# Patient Record
Sex: Female | Born: 1965 | Race: White | Hispanic: No | Smoking: Never smoker
Health system: Southern US, Community
[De-identification: ages and names within clinical notes are randomized; demographics above are authoritative.]

## PROBLEM LIST (undated history)

## (undated) DIAGNOSIS — K219 Gastro-esophageal reflux disease without esophagitis: Secondary | ICD-10-CM

## (undated) HISTORY — PX: OVARIAN CYST SURGERY: SHX726

## (undated) HISTORY — PX: TUBAL LIGATION: SHX77

---

## 2005-03-06 ENCOUNTER — Inpatient Hospital Stay (HOSPITAL_COMMUNITY): Admission: AD | Admit: 2005-03-06 | Discharge: 2005-03-06 | Payer: Self-pay | Admitting: *Deleted

## 2005-04-23 ENCOUNTER — Ambulatory Visit: Payer: Self-pay | Admitting: Sports Medicine

## 2005-08-16 DIAGNOSIS — S63509A Unspecified sprain of unspecified wrist, initial encounter: Secondary | ICD-10-CM | POA: Insufficient documentation

## 2005-10-11 ENCOUNTER — Ambulatory Visit: Payer: Self-pay | Admitting: Sports Medicine

## 2005-11-05 ENCOUNTER — Ambulatory Visit: Payer: Self-pay | Admitting: Sports Medicine

## 2005-11-16 ENCOUNTER — Emergency Department (HOSPITAL_COMMUNITY): Admission: EM | Admit: 2005-11-16 | Discharge: 2005-11-16 | Payer: Self-pay | Admitting: Emergency Medicine

## 2005-12-03 ENCOUNTER — Encounter: Admission: RE | Admit: 2005-12-03 | Discharge: 2005-12-03 | Payer: Self-pay | Admitting: Gastroenterology

## 2005-12-03 ENCOUNTER — Ambulatory Visit: Payer: Self-pay | Admitting: Sports Medicine

## 2006-01-02 ENCOUNTER — Ambulatory Visit (HOSPITAL_COMMUNITY): Admission: RE | Admit: 2006-01-02 | Discharge: 2006-01-02 | Payer: Self-pay | Admitting: Obstetrics and Gynecology

## 2006-01-02 ENCOUNTER — Encounter (INDEPENDENT_AMBULATORY_CARE_PROVIDER_SITE_OTHER): Payer: Self-pay | Admitting: *Deleted

## 2006-01-07 ENCOUNTER — Ambulatory Visit: Payer: Self-pay | Admitting: Sports Medicine

## 2006-04-29 ENCOUNTER — Ambulatory Visit: Payer: Self-pay | Admitting: Sports Medicine

## 2006-05-27 ENCOUNTER — Encounter (INDEPENDENT_AMBULATORY_CARE_PROVIDER_SITE_OTHER): Payer: Self-pay | Admitting: Family Medicine

## 2006-05-27 ENCOUNTER — Ambulatory Visit: Payer: Self-pay | Admitting: Sports Medicine

## 2006-10-31 ENCOUNTER — Ambulatory Visit: Payer: Self-pay | Admitting: Sports Medicine

## 2006-10-31 DIAGNOSIS — L259 Unspecified contact dermatitis, unspecified cause: Secondary | ICD-10-CM | POA: Insufficient documentation

## 2006-10-31 DIAGNOSIS — M25569 Pain in unspecified knee: Secondary | ICD-10-CM | POA: Insufficient documentation

## 2006-11-27 ENCOUNTER — Encounter: Admission: RE | Admit: 2006-11-27 | Discharge: 2006-11-27 | Payer: Self-pay | Admitting: Obstetrics and Gynecology

## 2006-12-12 ENCOUNTER — Ambulatory Visit: Payer: Self-pay | Admitting: Sports Medicine

## 2008-07-06 ENCOUNTER — Inpatient Hospital Stay (HOSPITAL_COMMUNITY): Admission: RE | Admit: 2008-07-06 | Discharge: 2008-07-08 | Payer: Self-pay | Admitting: Obstetrics and Gynecology

## 2008-07-06 ENCOUNTER — Encounter (HOSPITAL_COMMUNITY): Payer: Self-pay | Admitting: Obstetrics and Gynecology

## 2009-02-21 ENCOUNTER — Ambulatory Visit: Payer: Self-pay | Admitting: Family Medicine

## 2009-02-21 DIAGNOSIS — M6740 Ganglion, unspecified site: Secondary | ICD-10-CM | POA: Insufficient documentation

## 2009-02-21 DIAGNOSIS — M25539 Pain in unspecified wrist: Secondary | ICD-10-CM

## 2009-07-25 ENCOUNTER — Ambulatory Visit: Payer: Self-pay | Admitting: Sports Medicine

## 2009-07-27 ENCOUNTER — Ambulatory Visit: Payer: Self-pay | Admitting: Sports Medicine

## 2009-07-31 ENCOUNTER — Ambulatory Visit: Payer: Self-pay | Admitting: Sports Medicine

## 2009-09-06 ENCOUNTER — Encounter: Admission: RE | Admit: 2009-09-06 | Discharge: 2009-09-06 | Payer: Self-pay | Admitting: Obstetrics and Gynecology

## 2010-04-17 NOTE — Assessment & Plan Note (Signed)
Summary: R WRIST GANGLION   Vital Signs:  Patient profile:   45 year old female BP sitting:   119 / 84  Vitals Entered By: Lillia Pauls CMA (Jul 25, 2009 10:33 AM)  History of Present Illness: RT wrist ganglion was .4 x .6 cms in dec won't go away soemtimes uncomfortable limits wrist movement wants to see if we can inject  This first was hurt 4 years ago during a pregnancy  since then has come and gone saw [place] but declined any surgery  Allergies: No Known Drug Allergies  Physical Exam  General:  Well-developed,well-nourished,in no acute distress; alert,appropriate and cooperative throughout examination Msk:  RT wrist shows a soft movable cyst over radiocarpal joint mid this is compressible not painful not red  wrist extension is slt limited other motions are good Additional Exam:  MSK [place] RT wrist shows a ganglion that is .5cm x.7cm on long scan on Trans scan this is 1.1cm wide no inc in doppler flow   Impression & Recommendations:  Problem # 1:  GANGLION CYST, WRIST, RIGHT (ICD-727.41)  With US guidance we did injection and multiple punctures of ganglion visualized on [place]:  cleanse with alcohol Topical analgesic spray : Ethyl choride and then local wheal w 1% xylocaine 1cc Joint RT radio carpla ganglion  Approached in typical fashion with: US guidance demonstrated inj of fluid into cyst then we punctured in 4 separate areas  Completed without difficulty Meds: .5 cc kenalog 10 and .5 cc lidocaine Needle:21 G Aftercare instructions and Red flags advised  note after procedure this was much smaller and good wrist motion  Orders: [place] LIMITED (16109) Trigger Point Injection (1 or 2 muscles) (60454)  Problem # 2:  WRIST PAIN, RIGHT (ICD-719.43)  somewhat more painful than last visit  would use wrist splints next 6 wks  Orders: [place] LIMITED (09811) Trigger Point Injection (1 or 2 muscles) (91478)

## 2010-04-17 NOTE — Assessment & Plan Note (Signed)
Summary: 8:15AM- F/U PER Barnard Sharps,[facility]   History of Present Illness: Tara Lopez returns today for f/u of US guided drainage of ganglion cyst at RT radiocarpal joint 48 hrs p drainage she had a lot of wrist swelling we saw her and splinted and used prophylactic keflex within 24 more hours pan and swelling decreased  now usijng wrist freely no real swelling no fever bruising is fading some not using splint  Allergies: No Known Drug Allergies  Physical Exam  General:  Well-developed,well-nourished,in no acute distress; alert,appropriate and cooperative throughout examination Msk:  RT wrist has returned to baseline motion there is still a slt lag in full extension when compared to left brusing is noted no swelling no warmth ganglion cyst is no longer palpable   Impression & Recommendations:  Problem # 1:  GANGLION CYST, WRIST, RIGHT (ICD-727.41) This appears to have resolved w drainage  we should rescan in 8 wks as this has been such a persistent problem  Problem # 2:  WRIST PAIN, RIGHT (ICD-719.43) Assessment: Improved has improved and not usning splint or meds for pain  Complete Medication List: 1)  Keflex 500 Mg Caps (Cephalexin) .Marland Kitchen.. 1 by mouth bid

## 2010-04-17 NOTE — Assessment & Plan Note (Signed)
Summary: 8:30-CYST THAT WAS DRAINED IS INFECTED   Vital Signs:  Patient profile:   45 year old female Temp:     98.6 degrees F  Vitals Entered By: Lillia Pauls CMA (Jul 27, 2009 9:10 AM)  History of Present Illness: 2 days ago I did an injection and pucture of gaglion cyst at radiocarpal joint of RT hand in this patient.  last night became very painful, more bruising and swelling not much pain in first 24 hours no fever or chills no redness using wrist splint and that helps as movement of wrist is painful  Allergies: No Known Drug Allergies  Physical Exam  General:  Well-developed,well-nourished,in no acute distress; alert,appropriate and cooperative throughout examination afebrile Msk:  dorsum of RT wrist and hand are swollen there is no visible ganglion now but a lot of bruising around the area where this was injected and punctured wrist is painful w movement and this is limited 2/2 pain no redness or excess warmth  Repeat scan shows that the ganglion is essentially gone but there is now a joint effusion over radiocarpal joint there is now also increased doppler activity   Impression & Recommendations:  Problem # 1:  WRIST PAIN, RIGHT (ICD-719.43)  suspect this is a reactive effusion as I think the procedure prob triggered bleeding that went into the joint  warned about possibility of infection and I think we need to go ahead and start Keflex 2/2 risk of joint infection use 500 two times a day p 1000 first dose reck in 4 days  wrap and splint for support  warning signs - fever, redness, feeling sick, excess swelling call me and we will reck sooner  Orders: No Charge Patient Arrived (NCPA0) (NCPA0)  Problem # 2:  GANGLION CYST, WRIST, RIGHT (ICD-727.41)  I think the puncture eliminated most of swelling in ganglion  howeve, I wonder now if this was connected to joint capsule and not tendon sheath and that may have made joint irritation more likely  will  follow closely for return  Orders: No Charge Patient Arrived (NCPA0) (NCPA0)  Complete Medication List: 1)  Keflex 500 Mg Caps (Cephalexin) .Marland Kitchen.. 1 by mouth bid Prescriptions: KEFLEX 500 MG CAPS (CEPHALEXIN) 1 by mouth bid  #20 x 0   Entered and Authorized by:   Enid Baas MD   Signed by:   Enid Baas MD on 07/27/2009   Method used:   Electronically to        [facility]  [address] [phone]* (retail)       [address]       [place], [place]  [address]       Ph: [phone] or [phone]       Fax: [phone]   RxID:   [id number]

## 2010-06-27 LAB — CBC
HCT: 36.3 % (ref 36.0–46.0)
Hemoglobin: 12.6 g/dL (ref 12.0–15.0)
MCHC: 34.7 g/dL (ref 30.0–36.0)
MCV: 92.2 fL (ref 78.0–100.0)
Platelets: 160 10*3/uL (ref 150–400)
Platelets: 194 10*3/uL (ref 150–400)
RBC: 3.6 MIL/uL — ABNORMAL LOW (ref 3.87–5.11)
RBC: 3.95 MIL/uL (ref 3.87–5.11)
WBC: 10.8 10*3/uL — ABNORMAL HIGH (ref 4.0–10.5)

## 2010-06-27 LAB — RPR: RPR Ser Ql: NONREACTIVE

## 2010-06-27 LAB — TYPE AND SCREEN

## 2010-07-31 NOTE — Discharge Summary (Signed)
NAMECAMARI, Tara Lopez                ACCOUNT NO.:  [id number]   MEDICAL RECORD NO.:  [id number]          PATIENT TYPE:  INP   LOCATION:  9121                          FACILITY:  [facility]   PHYSICIAN:  Juluis Mire, M.D.   DATE OF BIRTH:  Nov 21, 1965   DATE OF ADMISSION:  07/06/2008  DATE OF DISCHARGE:  07/08/2008                               DISCHARGE SUMMARY   ADMITTING DIAGNOSES:  1. Intrauterine pregnancy at term.  2. Previous cesarean section.  3. Multiparity, desires permanent sterilization.   DISCHARGE DIAGNOSES:  1. Status post low-transverse cesarean section.  2. A viable female infant.  3. Permanent sterilization.   PROCEDURE:  1. Repeat low-transverse cesarean section.  2. Bilateral tubal ligation.   REASON FOR ADMISSION:  Please see written H and P.   HOSPITAL COURSE:  The patient is a 45 year old gravida 2, para 1, that  was admitted to [facility] for scheduled cesarean  section.  Due to multiparity, the patient had also requested permanent  sterilization.  On the morning of admission, the patient was taken to  the operating room where spinal anesthesia was administered without  difficulty.  A low-transverse incision was made with delivery of a  viable female infant weighing 7 pounds 7 ounces with Apgars of 8 at 1  minute and 9 at 5 minutes.  Bilateral tubal ligation was performed  without difficulty.  The patient tolerated the procedure well and was  taken to the recovery room in stable condition.  On postoperative day  #1, the patient was without complaints.  Vital signs were stable.  She  was afebrile.  Abdomen soft.  Fundus firm and nontender.  Abdominal  dressing was partially removed with an incision that was clean, dry, and  intact.  Foley had been discontinued.  The patient was voiding well.  Her laboratory findings showed hemoglobin 11.5, platelet count of  160,000, WBC count of 10.8.  Blood type is noted to be O positive.  On  postoperative  day #2, the patient did desire early discharge.  Vital  signs were stable.  She was afebrile.  Fundus firm and nontender.  Incision was clean, dry, and intact.  Staples were left intact.  Ecchymosis was noted inferior to the incisional site.  Discharge  instructions were reviewed and the patient was later discharged home.   CONDITION ON DISCHARGE:  Stable.   DIET:  Regular as tolerated.   ACTIVITY:  No heavy lifting, no driving x2 weeks, no vaginal entry.   FOLLOW UP:  The patient to follow up in the office in 2-3 days for  staple removal.  She is to call for temperature greater than 100  degrees, persistent nausea, vomiting, heavy vaginal bleeding and/or  redness or drainage from the incisional site.   DISCHARGE MEDICATIONS:  1. Tylox #30 one  p.o. every 4-6 hours p.r.n.  2. Motrin 600 mg every 6 hours.  3. Prenatal vitamins 1 p.o. daily.  4. Colace 1 p.o. daily p.r.n.      Julio Sicks, N.P.      Juluis Mire, M.D.  Electronically  Signed    CC/MEDQ  D:  07/08/2008  T:  07/09/2008  Job:  [id number]

## 2010-07-31 NOTE — Op Note (Signed)
NAMELAKELY, Tara Lopez                ACCOUNT NO.:  [id number]   MEDICAL RECORD NO.:  [id number]          PATIENT TYPE:  INP   LOCATION:  9121                          [facility]ACILITY:  [facility]   PHYSICIAN:  Zelphia Cairo, MD    DATE OF BIRTH:  [birth year]   DATE OF PROCEDURE:  07/06/2008  DATE OF DISCHARGE:                               OPERATIVE REPORT   PREOPERATIVE DIAGNOSES:  1. Intrauterine pregnancy at term.  2. Prior cesarean section.  3. Desires permanent sterilization.   PROCEDURES:  Repeat low transverse cesarean delivery with bilateral  partial salpingectomy.   SURGEON:  Zelphia Cairo, MD   ANESTHESIA:  Spinal.   FINDINGS:  Viable female infant with Apgars of 8 and 9.  Weight 7 pounds  7 ounces.  Normal pelvic anatomy.   ASSESSMENT:  Placenta for disposal.   URINE OUTPUT:  300 mL, clear.   ESTIMATED BLOOD LOSS:  500 mL.   COMPLICATIONS:  None.   CONDITION:  Stable to recovery room.   PROCEDURE:  [employer] was taken to the operating room where spinal  anesthesia was found to be adequate.  She was placed in the supine  position with the left tilt.  She was prepped and draped in sterile  fashion, and a Foley catheter was inserted sterilely.  A Pfannenstiel  skin incision was made with a scalpel and carried down to the underlying  fascia.  The fascia was incised in the midline.  This was extended  laterally using Mayo scissors.  Kocher clamps were used to grasp the  superior and inferior portion of the fascia.  This was tented upwards,  and the underlying rectus muscles were dissected off using curved Mayo  scissors.  Peritoneum was then tented upwards with 2 Hemostats and  entered sharply with Metzenbaum scissors.  This was extended superiorly  and inferiorly with blunt and sharp dissection.  Bladder blade was then  inserted.  Vesicouterine peritoneum was dissected off the lower uterine  segment.   Uterine incision was made with a scalpel, and extended bluntly using my  fingers.  The membranes were ruptured for thin meconium fluid.  Fetal  vertex was brought to the uterine incision.  Nuchal cord x1 was easily  reduced.  The mouth and nose were suctioned, and the shoulders and body  followed with fundal pressure.  Cord was clamped and cut as the mouth  and nose were again suctioned, and the infant was taken to the awaiting  pediatric staff.  The placenta was then manually removed from the  uterus.  The uterus was cleared off all clots and debris with a dry lap,  sponge, and the uterus was exteriorized from the pelvis.  The uterine  incision was reapproximated using 1-0 Chromic in a running locked  fashion.  Our attention was then turned to the fallopian tubes.   The left fallopian tube was grasped with a Babcock clamp and tented  upwards.  A small knuckle of fallopian tube was doubly tied using plain  gut suture.  Knuckle was excised using Metzenbaum scissors, hemostasis  was assured, and the procedure  was repeated on the right fallopian tube.  The uterine incision was then reinspected and found to be hemostatic.  Uterus was placed back into the pelvic activity, and the pelvis was  copiously irrigated with warm normal saline.  The uterine incision and  fallopian tube segments were inspected, and again found to be  hemostatic.  Peritoneum was reapproximated with 0 Monocryl.  The fascia  was closed with a loop of 0 PDS, and the skin was closed with staples.  Sponge, lap, needle, and instrument counts were correct x2.      Zelphia Cairo, MD  Electronically Signed     GA/MEDQ  D:  07/06/2008  T:  07/07/2008  Job:  [id number]

## 2010-08-03 NOTE — H&P (Signed)
NAMEBRYLEY, CHRISMAN NO.:  [id number]   MEDICAL RECORD NO.:  [id number]          PATIENT TYPE:  AMB   LOCATION:  SDC                           [facility]ILITY[facility]  [facility]   PHYSICIAN:  Zelphia Cairo, MD    DATE OF BIRTH:  1965/03/27   DATE OF ADMISSION:  01/02/2006  DATE OF DISCHARGE:                                HISTORY & PHYSICAL   A 45 year old white female referred by her obstetrician at [facility]  for a persistent right ovarian cyst.  It was found incidentally in December  2005.  It was followed throughout her pregnancy and never resolved.  On  followup evaluation prior to surgery, she became pregnant again.  She  underwent a cesarean section with her last pregnancy, and at that time the  cyst was visualized and appeared normal.  She denies any pain or complaints.  She is now approximately 3 months postpartum.   PAST MEDICAL HISTORY:  Negative.   PAST SURGICAL HISTORY:  Negative.   She is a nonsmoker.   OBSTETRICS HISTORY:  Two cesarean sections full term.   GYNECOLOGIC HISTORY:  Negative for abnormal Pap smears.   FAMILY HISTORY:  Diabetes, paternal grandfather with colon cancer.   ALLERGIES:  NONE.   MEDICATIONS:  Prenatal vitamins.   PHYSICAL EXAM:  Height 5 feet 7 inches, weight 154, blood pressure 108/74,  hemoglobin 14.  Urinalysis negative.  HEAD AND NECK EXAM:  Normal.  HEART:  Regular rate and rhythm.  LUNGS:  Clear bilaterally.  ABDOMEN:  Soft, nontender.  No palpable masses.  No rebound or guarding.  PELVIC EXAM:  Reveals normal external female genitalia.  Vagina and cervix  are normal, no lesions.  Uterus is mobile and nontender.  There is a smooth  mobile, nontender mass in the posterior cul-de-sac just right of the uterus.   On ultrasound the right ovarian cyst measure 6.7 x 5.3 x 5 cm.  It is simple  in appearance.  There is no free fluid or solid areas noted.   A 45 year old white female with a persistent right ovarian cyst  presents  today for a laparoscopic right ovarian cystectomy and a Mirena IUD insertion  for contraception.      Zelphia Cairo, MD  Electronically Signed     GA/MEDQ  D:  01/01/2006  T:  01/02/2006  Job:  [id number]

## 2010-08-03 NOTE — Op Note (Signed)
Tara Lopez, Tara Lopez                ACCOUNT NO.:  [id number]   MEDICAL RECORD NO.:  [id number]          PATIENT TYPE:  AMB   LOCATION:  SDC                           [facility]   PHYSICIAN:  Anselmo Rod, M.D.  DATE OF BIRTH:  10/20/1965   DATE OF PROCEDURE:  11/16/2005  DATE OF DISCHARGE:  11/16/2005                                 OPERATIVE REPORT   PROCEDURE PERFORMED:  Diagnostic esophagogastroduodenoscopy.   ENDOSCOPIST:  Anselmo Rod, M.D.   INSTRUMENT USED:  Olympus video panendoscope.   INDICATION FOR PROCEDURE:  A 45 year old white female with a history of  intermittent dysphagia undergoing EGD for question food impaction, rule out  peptic stricture, esophagitis, etc.   PREPROCEDURE PREPARATION:  Informed consent was procured from the patient.  The patient was fasted for over 4 hours prior to the procedure.  Risks and  benefits of the procedure were discussed with the patient in great detail.   PREPROCEDURE PHYSICAL:  Patient had stable vital signs.  NECK:  Supple.  CHEST:  Clear to auscultation.  S1, S2 regular.  ABDOMEN:  Soft with normal bowel sounds.   DESCRIPTION OF THE PROCEDURE:  The patient was placed in the left lateral  decubitus position, sedated with 75 mcg of fentanyl and 6 mg of Versed in  slow incremental doses.  Once the patient was adequately sedated and  maintained on low flow oxygen and continuous cardiac monitoring, the Olympus  video panendoscope was advanced through the mouthpiece, over the tongue,  into the esophagus under direct vision.  The proximal esophagus appeared  normal.  A large, 2 inch long, Mallory-Weiss tear was noted in the distal  esophagus which I suspect had been caused by the patient's severe nausea and  vomiting, this was at the GE junction.  A dilated esophagus was appreciated  with a large amount of debris in the distal esophagus, rule out achalasia.  There was debris in the stomach as well, and therefore the  majority of the  gastric mucosa in the proximal small bowel could not be visualized.  Retroflexion was not performed, scope was withdrawn.  There was no visible  vessel present.  The patient tolerated the procedure well without  complications.   IMPRESSION:  1.Large Mallory-Weiss tear present without visible vessel.  2.?Dilated esophagus.  3.Large amount of debris in the stomach, gastric mucosa and proximal small  bowel not visualized.   RECOMMENDATIONS:  1.Carafate slurry 1 g q.i.d. along with Prevacid SoluTab  at 30 mg b.i.d. being advised and prescriptions have been handed to the  patient's husband, [name], for the next 1 month.  She will be seeing me in the  office in the next couple of days and further recommendations will be made  at that time.  2.A low residue soft diet has been advised for now.  I will contact her over  the phone in next couple of days and make sure she is doing well.  If she  has any abnormal complications or GI bleeding, she is to contact me  immediately for further recommendations.  Anselmo Rod, M.D.  Electronically Signed     JNM/MEDQ  D:  11/17/2005  T:  11/18/2005  Job:  [id number]   cc:   Zelphia Cairo, MD

## 2010-08-03 NOTE — Discharge Summary (Signed)
Tara Lopez, Tara Lopez                ACCOUNT NO.:  [id number]   MEDICAL RECORD NO.:  [id number]          PATIENT TYPE:  INP   LOCATION:  9121                          [facility]   PHYSICIAN:  Juluis Mire, M.D.   DATE OF BIRTH:  10-21-1965   DATE OF ADMISSION:  07/06/2008  DATE OF DISCHARGE:  07/08/2008                               DISCHARGE SUMMARY   ADMITTING DIAGNOSES:  1. Intrauterine pregnancy at term.  2. Previous cesarean section.  3. Multiparity, desires permanent sterilization.   DISCHARGE DIAGNOSES:  1. Status post low transverse cesarean section.  2. Viable female infant.  3. Permanent sterilization.   PROCEDURES:  1. Repeat low transverse cesarean section.  2. Bilateral tubal ligation.   REASON FOR ADMISSION:  Please see written H and P.   HOSPITAL COURSE:  The patient is a 45 year old married female, gravida  3, para 3, that was admitted to [facility] for  scheduled cesarean section.  The patient had had a previous cesarean  delivery desired repeat.  Due to multiparity, the patient also requested  permanent sterilization.  On the morning of admission, the patient was  taken to the operating room where spinal anesthesia was administered  without difficulty.  A low transverse incision was made with delivery of  a viable female infant weighing 7 pounds 11 ounces with Apgars of 8 at  one minute and 9 at five minutes.  Bilateral tubal ligation was  performed without difficulty.  The patient tolerated the procedure well  and was taken to the recovery room in stable condition.  On  postoperative day #1, the patient was without complaint.  Vital signs  remained stable.  She was afebrile.  Abdomen soft.  Fundus firm and  nontender.  Abdominal dressing was partially removed, revealing an  incision that was clean, dry, and intact.  Foley has been discontinued  and she is voiding well.  Laboratory findings showed hemoglobin of 11.5,  platelet count  160,000, WBC count of 10.8.  Blood type was noted to be O  positive.  On postoperative day #2, the patient desired to be  discharged.  Fundus is firm and nontender.  Incision was clean, dry, and  intact.  Ecchymosis was noted inferior to the incisional site.  Staples  were left in intact.  Discharge instructions were reviewed and the  patient was later discharged home.   CONDITION ON DISCHARGE:  Stable.   DIET:  Regular as tolerated.   ACTIVITY:  No heavy lifting, no driving x2 weeks, and no vaginal entry.   FOLLOWUP:  The patient is to follow up in the office in 2-3 weeks for  staple removal.  She is to call for temperature greater than 100  degrees, persistent nausea, vomiting, heavy vaginal bleeding and/or  redness or drainage from the incisional site.   DISCHARGE MEDICATIONS:  1. Tylox #30 one p.o. every 4-6 hours.  2. Motrin 600 mg every 6 hours.  3. Prenatal vitamins one p.o. daily.  4. Colace 1 p.o. daily p.r.n.      Julio Sicks, N.P.  Juluis Mire, M.D.  Electronically Signed    CC/MEDQ  D:  07/26/2008  T:  07/27/2008  Job:  [id number]

## 2010-08-03 NOTE — Op Note (Signed)
Tara Lopez, Tara Lopez                ACCOUNT NO.:  [id number]   MEDICAL RECORD NO.:  [id number]          PATIENT TYPE:  AMB   LOCATION:  SDC                           FACILITY:  [facility]   PHYSICIAN:  Zelphia Cairo, MD    DATE OF BIRTH:  [birth year]   DATE OF PROCEDURE:  01/02/2006  DATE OF DISCHARGE:                                 OPERATIVE REPORT   PREOPERATIVE DIAGNOSIS:  1. Persistent right ovarian cyst.  2. Desires Marina insertion for contraception.   POSTOPERATIVE DIAGNOSIS:  1. Persistent right ovarian cyst.  2. Desires Marina insertion for contraception.   PROCEDURE:  Laparoscopic right ovarian cystectomy and Marina IUD insertion.   SURGEON:  Zelphia Cairo, M.D.   ANESTHESIA:  General.   SPECIMEN:  Right ovarian cyst wall.   ESTIMATED BLOOD LOSS:  Less than 100 mL.   COMPLICATIONS:  None.   CONDITION:  Stable and extubated to recovery room.   PROCEDURE:  The patient was taken to the operating room where general  anesthesia was obtained. She was placed in the dorsal lithotomy position  using Allen stirrups.  She was prepped and draped in a sterile fashion and a  red rubber catheter was used to drain her bladder for approximately 200 mL.  A speculum was then placed in the vagina, a single-tooth tenaculum on the  anterior lip of the cervix, and a Hulka clamp was placed through the cervix  to provide uterine manipulation.  The tenaculum and speculum were then  removed from the vagina.   Our attention was then turned to the abdomen where an infraumbilical skin  incision was made with the scalpel.  The blunt trocar was then passed  through the incision intraperitoneally under direct visualization.  Once  intraperitoneal placement was confirmed, the CO2 gas was turned on and the  abdomen and pelvis were insufflated.  Next, a 5 mm suprapubic incision was  made with the scalpel and a 5 mm trocar was inserted under direct  visualization.  A blunt probe was placed through  the 5 mm trocar.  The  patient was placed in Trendelenburg and the right adnexa was flipped into  view and the right ovarian cyst was visualized.  The cyst appeared simple  and fluid filled.  There were no solid components or excretions noted.  A  small incision was made overlying the ovarian cyst using the hot scissors.  This was extended superiorly and inferiorly.  The cyst wall was grasped with  blunt graspers and the ovarian tissue was grasped with blunt graspers and  the ovarian cyst wall was dissected away from the ovary.  The ovarian cyst  did rupture during this portion of the procedure and clear fluid was noted  at that time.  The ovarian cyst wall was then removed from the pelvis.  The  base of the ovary was then coagulated using the hot scissors.  The pelvis  was then irrigated with normal saline.  The ovary was noted to be  hemostatic.  All trocars and instruments were removed from the abdomen and  pelvis.  The fascia  of the infraumbilical skin incision was then closed  using Vicryl and the skin was reapproximated with 3-0 Vicryl.  Steri-Strips  were placed.  The two small incisions were then injected with Marcaine for  local anesthesia.   Our attention was then turned to the vagina.  The Hulka clamp was removed  from the uterus and a speculum was placed to visualize the cervix.  A single-  tooth tenaculum was placed on the anterior lip of the cervix and the uterus  was sounded to 7 cm. The Jearld Adjutant IUD was then inserted using standard  procedure and the strings were cut approximately 2 cm from the os.  The  patient tolerated the procedure well.  Sponge, lap, needle and instrument  counts were correct x2. She was taken to the recovery room in stable  condition.      Zelphia Cairo, MD  Electronically Signed     GA/MEDQ  D:  01/02/2006  T:  01/03/2006  Job:  [id number]

## 2010-09-17 ENCOUNTER — Other Ambulatory Visit: Payer: Self-pay | Admitting: Obstetrics and Gynecology

## 2010-09-17 DIAGNOSIS — Z1231 Encounter for screening mammogram for malignant neoplasm of breast: Secondary | ICD-10-CM

## 2010-09-27 ENCOUNTER — Ambulatory Visit
Admission: RE | Admit: 2010-09-27 | Discharge: 2010-09-27 | Disposition: A | Discharge status: Home / Self Care | Payer: BC Managed Care – PPO | Source: Ambulatory Visit | Attending: Obstetrics and Gynecology | Admitting: Obstetrics and Gynecology

## 2010-09-27 DIAGNOSIS — Z1231 Encounter for screening mammogram for malignant neoplasm of breast: Secondary | ICD-10-CM

## 2011-03-08 ENCOUNTER — Other Ambulatory Visit: Payer: Self-pay | Admitting: Obstetrics and Gynecology

## 2011-03-08 DIAGNOSIS — N644 Mastodynia: Secondary | ICD-10-CM

## 2011-03-26 ENCOUNTER — Other Ambulatory Visit: Payer: BC Managed Care – PPO

## 2011-05-21 ENCOUNTER — Ambulatory Visit
Admission: RE | Admit: 2011-05-21 | Discharge: 2011-05-21 | Disposition: A | Discharge status: Home / Self Care | Payer: 59 | Source: Ambulatory Visit | Attending: Obstetrics and Gynecology | Admitting: Obstetrics and Gynecology

## 2011-05-21 DIAGNOSIS — N644 Mastodynia: Secondary | ICD-10-CM

## 2011-09-08 ENCOUNTER — Encounter (HOSPITAL_COMMUNITY): Payer: Self-pay | Admitting: *Deleted

## 2011-09-08 ENCOUNTER — Emergency Department (HOSPITAL_COMMUNITY)
Admission: EM | Admit: 2011-09-08 | Discharge: 2011-09-08 | Disposition: A | Discharge status: Home / Self Care | Payer: 59 | Source: Home / Self Care | Attending: Emergency Medicine | Admitting: Emergency Medicine

## 2011-09-08 DIAGNOSIS — T148XXA Other injury of unspecified body region, initial encounter: Secondary | ICD-10-CM

## 2011-09-08 HISTORY — DX: Gastro-esophageal reflux disease without esophagitis: K21.9

## 2011-09-08 LAB — POCT URINALYSIS DIP (DEVICE)
Glucose, UA: NEGATIVE mg/dL
Ketones, ur: NEGATIVE mg/dL
Leukocytes, UA: NEGATIVE
Protein, ur: 30 mg/dL — AB
Specific Gravity, Urine: 1.02 (ref 1.005–1.030)
pH: 5.5 (ref 5.0–8.0)

## 2011-09-08 MED ORDER — IBUPROFEN 800 MG PO TABS: 800.0000 mg | ORAL_TABLET | Freq: Three times a day (TID) | ORAL | Status: AC

## 2011-09-08 MED ORDER — CYCLOBENZAPRINE HCL 10 MG PO TABS: 5.0000 mg | ORAL_TABLET | Freq: Three times a day (TID) | ORAL | Status: AC | PRN

## 2011-09-08 NOTE — ED Notes (Addendum)
C/O gradual onset right low back pain on 6/19 without any significant activity.  Pain started as a soreness; yesterday pain became worse.  C/O very painful movements, such as climbing in bed, pulling a lamp cord, rolling over.  Has tried massage, Aleve, IBU, and Tyl.  States has hx of UTIs without any sxs until infection has progressed significantly.  Was found to have UTI approx 2-3 wks ago without sxs - complete course of ?Cipro.  Has been having back pain since March, but "this is different".  Denies any nausea.

## 2011-09-08 NOTE — ED Provider Notes (Signed)
History     CSN: [id number]  Arrival date & time 09/08/11  1127   First MD Initiated Contact with Patient 09/08/11 1323      Chief Complaint  Patient presents with  . Back Pain    (Consider location/radiation/quality/duration/timing/severity/associated sxs/prior treatment) HPI Comments: Pt denies injury or recent physical activity different than normal.  Sedentary work.  Pain very mild when at rest, severe when rolling over in bed, changing positions in general.    Patient is a 46 y.o. female presenting with back pain. The history is provided by the patient.  Back Pain  This is a new problem. The problem occurs constantly. The problem has been gradually worsening. The pain is associated with no known injury. The pain is present in the lumbar spine. The pain is at a severity of 2/10. The symptoms are aggravated by twisting (movement). The pain is the same all the time. Pertinent negatives include no fever, no numbness, no abdominal pain, no dysuria, no tingling and no weakness. She has tried NSAIDs and heat for the symptoms. The treatment provided mild relief.    Past Medical History  Diagnosis Date  . GERD (gastroesophageal reflux disease)     Past Surgical History  Procedure Date  . Cesarean section     x3  . Tubal ligation   . Ovarian cyst surgery     History reviewed. No pertinent family history.  History  Substance Use Topics  . Smoking status: Never Smoker   . Smokeless tobacco: Not on file  . Alcohol Use: 4.2 oz/week    7 Glasses of wine per week     1 glass wine daily    OB History    Grav Para Term Preterm Abortions TAB SAB Ect Mult Living                  Review of Systems  Constitutional: Negative for fever and chills.  Gastrointestinal: Negative for nausea, vomiting and abdominal pain.  Genitourinary: Negative for dysuria and flank pain.  Musculoskeletal: Positive for back pain.  Skin: Negative for color change and rash.  Neurological: Negative for  tingling, weakness and numbness.    Allergies  Other  Home Medications   Current Outpatient Rx  Name Route Sig Dispense Refill  . ESOMEPRAZOLE MAGNESIUM 40 MG PO CPDR Oral Take 40 mg by mouth daily before breakfast.    . PROBIOTIC FORMULA PO Oral Take 1 tablet by mouth daily.    . CYCLOBENZAPRINE HCL 10 MG PO TABS Oral Take 0.5 tablets (5 mg total) by mouth 3 (three) times daily as needed for muscle spasms. 21 tablet 0  . IBUPROFEN 800 MG PO TABS Oral Take 1 tablet (800 mg total) by mouth 3 (three) times daily. 21 tablet 0    BP 122/57  Pulse 72  Temp 98.4 F (36.9 C) (Oral)  Resp 14  SpO2 98%  LMP 08/05/2011  Physical Exam  Constitutional: She appears well-developed and well-nourished. No distress.  Pulmonary/Chest: Effort normal.  Abdominal: There is no CVA tenderness.  Musculoskeletal:       Lumbar back: She exhibits tenderness. She exhibits no swelling, no edema and no deformity.       Back:  Skin: Skin is warm, dry and intact. No rash noted.    ED Course  Procedures (including critical care time)  Labs Reviewed  POCT URINALYSIS DIP (DEVICE) - Abnormal; Notable for the following:    Protein, ur 30 (*)     All other  components within normal limits  POCT PREGNANCY, URINE   No results found.   1. Muscle strain       MDM  Given pt's description of sx, most likely muscle strain.  Discussed with pt alternate possibilities, reasons for f/u with pcp.         Cathlyn Parsons, NP 09/08/11 2201

## 2011-09-08 NOTE — Discharge Instructions (Signed)
Apply moist heat (either by warm compress or warm bath) at least twice a day.  Preferably, use epsom salt in the bath water or warm compress to help the muscle inflammation heal.   If your symptoms get worse, or are not getting better, see Dr. Wylene Simmer or return here if needed.  Muscle Strain A muscle strain (pulled muscle) happens when a muscle is over-stretched. Recovery usually takes 5 to 6 weeks.  HOME CARE   Put ice on the injured area.   Put ice in a plastic bag.   Place a towel between your skin and the bag.   Leave the ice on for 15 to 20 minutes at a time, every hour for the first 2 days.   Do not use the muscle for several days or until your doctor says you can. Do not use the muscle if you have pain.   Wrap the injured area with an elastic bandage for comfort. Do not put it on too tightly.   Only take medicine as told by your doctor.   Warm up before exercise. This helps prevent muscle strains.  GET HELP RIGHT AWAY IF:  There is increased pain or puffiness (swelling) in the affected area. MAKE SURE YOU:   Understand these instructions.   Will watch your condition.   Will get help right away if you are not doing well or get worse.  Document Released: 12/12/2007 Document Revised: 02/21/2011 Document Reviewed: 12/12/2007 [facility] Patient Information 2012 [place], [place].

## 2011-09-11 NOTE — ED Provider Notes (Signed)
Medical screening examination/treatment/procedure(s) were performed by non-physician practitioner and as supervising physician I was immediately available for consultation/collaboration.  Luiz Blare MD   Luiz Blare, MD 09/11/11 [phone]

## 2011-09-13 ENCOUNTER — Other Ambulatory Visit: Payer: Self-pay | Admitting: Obstetrics and Gynecology

## 2011-09-13 DIAGNOSIS — Z1231 Encounter for screening mammogram for malignant neoplasm of breast: Secondary | ICD-10-CM

## 2011-10-01 ENCOUNTER — Ambulatory Visit
Admission: RE | Admit: 2011-10-01 | Discharge: 2011-10-01 | Disposition: A | Discharge status: Home / Self Care | Payer: 59 | Source: Ambulatory Visit | Attending: Obstetrics and Gynecology | Admitting: Obstetrics and Gynecology

## 2011-10-01 DIAGNOSIS — Z1231 Encounter for screening mammogram for malignant neoplasm of breast: Secondary | ICD-10-CM

## 2012-02-28 ENCOUNTER — Other Ambulatory Visit: Payer: Self-pay | Admitting: Internal Medicine

## 2012-02-28 DIAGNOSIS — R109 Unspecified abdominal pain: Secondary | ICD-10-CM

## 2012-03-03 ENCOUNTER — Ambulatory Visit
Admission: RE | Admit: 2012-03-03 | Discharge: 2012-03-03 | Disposition: A | Discharge status: Home / Self Care | Payer: 59 | Source: Ambulatory Visit | Attending: Internal Medicine | Admitting: Internal Medicine

## 2012-03-03 DIAGNOSIS — R109 Unspecified abdominal pain: Secondary | ICD-10-CM

## 2012-05-02 ENCOUNTER — Other Ambulatory Visit: Payer: Self-pay

## 2012-05-07 ENCOUNTER — Other Ambulatory Visit: Payer: Self-pay | Admitting: Gastroenterology

## 2012-05-07 DIAGNOSIS — R1013 Epigastric pain: Secondary | ICD-10-CM

## 2012-05-22 ENCOUNTER — Encounter (HOSPITAL_COMMUNITY)
Admission: RE | Admit: 2012-05-22 | Discharge: 2012-05-22 | Disposition: A | Discharge status: Home / Self Care | Payer: 59 | Source: Ambulatory Visit | Attending: Gastroenterology | Admitting: Gastroenterology

## 2012-05-22 ENCOUNTER — Encounter (HOSPITAL_COMMUNITY): Payer: Self-pay

## 2012-05-22 DIAGNOSIS — R1013 Epigastric pain: Secondary | ICD-10-CM | POA: Insufficient documentation

## 2012-05-22 MED ORDER — TECHNETIUM TC 99M MEBROFENIN IV KIT
5.1000 | PACK | Freq: Once | INTRAVENOUS | Status: AC | PRN
  Administered 2012-05-22: 5.1 via INTRAVENOUS

## 2012-06-10 ENCOUNTER — Other Ambulatory Visit: Payer: Self-pay | Admitting: Obstetrics and Gynecology

## 2012-06-10 DIAGNOSIS — N63 Unspecified lump in unspecified breast: Secondary | ICD-10-CM

## 2012-06-19 ENCOUNTER — Ambulatory Visit
Admission: RE | Admit: 2012-06-19 | Discharge: 2012-06-19 | Disposition: A | Discharge status: Home / Self Care | Payer: 59 | Source: Ambulatory Visit | Attending: Obstetrics and Gynecology | Admitting: Obstetrics and Gynecology

## 2012-06-19 DIAGNOSIS — N63 Unspecified lump in unspecified breast: Secondary | ICD-10-CM

## 2012-09-16 ENCOUNTER — Other Ambulatory Visit: Payer: Self-pay

## 2012-09-16 DIAGNOSIS — Z1231 Encounter for screening mammogram for malignant neoplasm of breast: Secondary | ICD-10-CM

## 2012-10-13 ENCOUNTER — Ambulatory Visit
Admission: RE | Admit: 2012-10-13 | Discharge: 2012-10-13 | Disposition: A | Discharge status: Home / Self Care | Payer: 59 | Source: Ambulatory Visit

## 2012-10-13 DIAGNOSIS — Z1231 Encounter for screening mammogram for malignant neoplasm of breast: Secondary | ICD-10-CM

## 2013-01-21 ENCOUNTER — Other Ambulatory Visit: Payer: Self-pay

## 2013-09-08 ENCOUNTER — Ambulatory Visit
Admission: RE | Admit: 2013-09-08 | Discharge: 2013-09-08 | Disposition: A | Discharge status: Home / Self Care | Payer: 59 | Source: Ambulatory Visit | Attending: Family Medicine | Admitting: Family Medicine

## 2013-09-08 ENCOUNTER — Other Ambulatory Visit: Payer: Self-pay | Admitting: Family Medicine

## 2013-09-08 DIAGNOSIS — M533 Sacrococcygeal disorders, not elsewhere classified: Secondary | ICD-10-CM

## 2013-12-02 ENCOUNTER — Other Ambulatory Visit: Payer: Self-pay

## 2013-12-02 DIAGNOSIS — Z1231 Encounter for screening mammogram for malignant neoplasm of breast: Secondary | ICD-10-CM

## 2013-12-14 ENCOUNTER — Ambulatory Visit
Admission: RE | Admit: 2013-12-14 | Discharge: 2013-12-14 | Disposition: A | Discharge status: Home / Self Care | Payer: Managed Care, Other (non HMO) | Source: Ambulatory Visit

## 2013-12-14 DIAGNOSIS — Z1231 Encounter for screening mammogram for malignant neoplasm of breast: Secondary | ICD-10-CM

## 2014-01-11 ENCOUNTER — Other Ambulatory Visit: Payer: Self-pay | Admitting: Obstetrics and Gynecology

## 2014-01-12 LAB — CYTOLOGY - PAP

## 2014-02-24 ENCOUNTER — Other Ambulatory Visit: Payer: Self-pay | Admitting: Gastroenterology

## 2014-02-24 DIAGNOSIS — R1310 Dysphagia, unspecified: Secondary | ICD-10-CM

## 2014-03-03 ENCOUNTER — Other Ambulatory Visit (HOSPITAL_COMMUNITY): Payer: Managed Care, Other (non HMO)

## 2014-03-22 ENCOUNTER — Ambulatory Visit (HOSPITAL_COMMUNITY)
Admission: RE | Admit: 2014-03-22 | Discharge: 2014-03-22 | Disposition: A | Discharge status: Home / Self Care | Payer: 59 | Source: Ambulatory Visit | Attending: Gastroenterology | Admitting: Gastroenterology

## 2014-03-22 DIAGNOSIS — K219 Gastro-esophageal reflux disease without esophagitis: Secondary | ICD-10-CM | POA: Insufficient documentation

## 2014-03-22 DIAGNOSIS — R1310 Dysphagia, unspecified: Secondary | ICD-10-CM | POA: Insufficient documentation

## 2014-03-22 DIAGNOSIS — K449 Diaphragmatic hernia without obstruction or gangrene: Secondary | ICD-10-CM | POA: Insufficient documentation

## 2014-06-22 ENCOUNTER — Emergency Department (HOSPITAL_COMMUNITY)
Admission: EM | Admit: 2014-06-22 | Discharge: 2014-06-22 | Disposition: A | Discharge status: Home / Self Care | Payer: 59 | Source: Home / Self Care | Attending: Emergency Medicine | Admitting: Emergency Medicine

## 2014-06-22 ENCOUNTER — Encounter (HOSPITAL_COMMUNITY): Payer: Self-pay | Admitting: Emergency Medicine

## 2014-06-22 DIAGNOSIS — N390 Urinary tract infection, site not specified: Secondary | ICD-10-CM | POA: Diagnosis not present

## 2014-06-22 LAB — POCT URINALYSIS DIP (DEVICE)
BILIRUBIN URINE: NEGATIVE
Glucose, UA: NEGATIVE mg/dL
KETONES UR: NEGATIVE mg/dL
Nitrite: NEGATIVE
PH: 6 (ref 5.0–8.0)
Protein, ur: NEGATIVE mg/dL
Specific Gravity, Urine: 1.025 (ref 1.005–1.030)
Urobilinogen, UA: 0.2 mg/dL (ref 0.0–1.0)

## 2014-06-22 LAB — POCT PREGNANCY, URINE: Preg Test, Ur: NEGATIVE

## 2014-06-22 MED ORDER — CEPHALEXIN 500 MG PO CAPS: 500.0000 mg | ORAL_CAPSULE | Freq: Four times a day (QID) | ORAL | Status: DC

## 2014-06-22 NOTE — ED Notes (Signed)
Pt states that she has frequent urination and she has a UTI.

## 2014-06-22 NOTE — Discharge Instructions (Signed)
You have a UTI. Take Keflex 4 times a day for 7 days. Make sure you're drinking plenty of water. If we need to change your antibiotics based on culture results, we will call you. Follow-up as needed.

## 2014-06-22 NOTE — ED Provider Notes (Signed)
CSN: [id number]     Arrival date & time 06/22/14  1927 History   First MD Initiated Contact with Patient 06/22/14 1938     Chief Complaint  Patient presents with  . Urinary Tract Infection   (Consider location/radiation/quality/duration/timing/severity/associated sxs/prior Treatment) HPI She is a 49 year old woman here for evaluation of UTI. She states her symptoms started about 3 weeks ago. At that time she took over-the-counter AZO and had improvement. Her symptoms recurred a week ago. She again took Azo with improvement of her symptoms. Then, in the last day or 2 her symptoms have come back. She reports bladder spasms, which is her typical presenting sign of infection. She denies any dysuria or frequency. She does report some mild right flank pain. No fevers or nausea.  Past Medical History  Diagnosis Date  . GERD (gastroesophageal reflux disease)    Past Surgical History  Procedure Laterality Date  . Cesarean section      x3  . Tubal ligation    . Ovarian cyst surgery     History reviewed. No pertinent family history. History  Substance Use Topics  . Smoking status: Never Smoker   . Smokeless tobacco: Not on file  . Alcohol Use: 4.2 oz/week    7 Glasses of wine per week     Comment: 1 glass wine daily   OB History    No data available     Review of Systems As in history of present illness Allergies  Other  Home Medications   Prior to Admission medications   Medication Sig Start Date End Date Taking? Authorizing Provider  cephALEXin (KEFLEX) 500 MG capsule Take 1 capsule (500 mg total) by mouth 4 (four) times daily. 06/22/14   Melony Overly, MD  esomeprazole (NEXIUM) 40 MG capsule Take 40 mg by mouth daily before breakfast.    Historical Provider, MD  Probiotic Product (PROBIOTIC FORMULA PO) Take 1 tablet by mouth daily.    Historical Provider, MD   BP 126/81 mmHg  Pulse 73  Temp(Src) 98.5 F (36.9 C) (Oral)  Resp 18  SpO2 98%  LMP 06/16/2014 Physical Exam   Constitutional: She is oriented to person, place, and time. She appears well-developed and well-nourished. No distress.  Cardiovascular: Normal rate.   Pulmonary/Chest: Effort normal.  Abdominal: Soft. Bowel sounds are normal. She exhibits no distension. There is tenderness (mild suprapubic). There is no rebound and no guarding.  No CVA tenderness  Neurological: She is alert and oriented to person, place, and time.    ED Course  Procedures (including critical care time) Labs Review Labs Reviewed  POCT URINALYSIS DIP (DEVICE) - Abnormal; Notable for the following:    Hgb urine dipstick MODERATE (*)    Leukocytes, UA SMALL (*)    All other components within normal limits  URINE CULTURE  POCT PREGNANCY, URINE    Imaging Review No results found.   MDM   1. UTI (lower urinary tract infection)    Urine sent for culture. Start Keflex for a 7 day course. Follow-up as needed.    Melony Overly, MD 06/22/14 2006

## 2014-06-24 LAB — URINE CULTURE

## 2014-10-10 IMAGING — NM NM HEPATO W/GB/PHARM/[PERSON_NAME]
1 series · 12 of 12 positions shown · non-contrast
Comparison: Abdominal ultrasound 03/03/2012.

CLINICAL DATA: Epigastric and right upper quadrant abdominal pain.

NUCLEAR MEDICINE HEPATOBILIARY IMAGING WITH GALLBLADDER EF
TECHNIQUE: Sequential images of the abdomen were obtained [DATE] minutes following intravenous administration of
radiopharmaceutical. After oral ingestion of Ensure, gallbladder
ejection fraction was determined.
Radiopharmaceutical:  5.1 mCi Gc-77m Choletec

[Series 1: hepato · 4.46mm/px · 2 acquisitions, 12 frames shown]
[im 1/2]
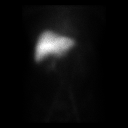
[im 1/2]
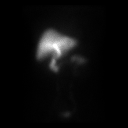
[im 1/2]
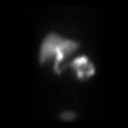
[im 1/2]
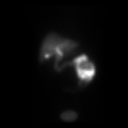
[im 1/2]
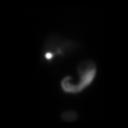
[im 1/2]
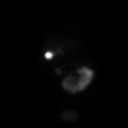
[im 2/2]
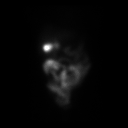
[im 2/2]
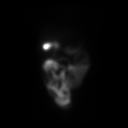
[im 2/2]
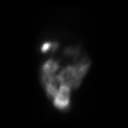
[im 2/2]
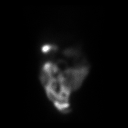
[im 2/2]
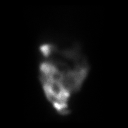
[im 2/2]
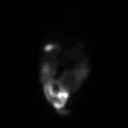

[12 of 12 positions shown; findings below may reference images not displayed]

FINDINGS: Initial images demonstrate homogenous hepatic activity
and prompt visualization of the biliary system, gallbladder and
small bowel.  Post Ensure, there is adequate gallbladder
contraction and progressive small bowel activity.

Gallbladder ejection fraction:  64.2%. Normal gallbladder ejection
fraction with Ensure is greater than 33%.

The patient did not experience symptoms  after oral ingestion of
Ensure.
IMPRESSION: Normal examination.  The cystic and common bile ducts are patent.
The gallbladder ejection fraction is 64%.

## 2014-12-14 ENCOUNTER — Other Ambulatory Visit: Payer: Self-pay

## 2014-12-14 DIAGNOSIS — Z1231 Encounter for screening mammogram for malignant neoplasm of breast: Secondary | ICD-10-CM

## 2015-01-17 ENCOUNTER — Ambulatory Visit
Admission: RE | Admit: 2015-01-17 | Discharge: 2015-01-17 | Disposition: A | Discharge status: Home / Self Care | Payer: 59 | Source: Ambulatory Visit

## 2015-01-17 DIAGNOSIS — Z1231 Encounter for screening mammogram for malignant neoplasm of breast: Secondary | ICD-10-CM

## 2015-03-22 DIAGNOSIS — M545 Low back pain: Secondary | ICD-10-CM | POA: Diagnosis not present

## 2015-06-01 DIAGNOSIS — H52223 Regular astigmatism, bilateral: Secondary | ICD-10-CM | POA: Diagnosis not present

## 2015-06-01 DIAGNOSIS — H5203 Hypermetropia, bilateral: Secondary | ICD-10-CM | POA: Diagnosis not present

## 2015-06-01 DIAGNOSIS — H524 Presbyopia: Secondary | ICD-10-CM | POA: Diagnosis not present

## 2015-06-20 DIAGNOSIS — F4323 Adjustment disorder with mixed anxiety and depressed mood: Secondary | ICD-10-CM | POA: Diagnosis not present

## 2015-07-13 DIAGNOSIS — F4323 Adjustment disorder with mixed anxiety and depressed mood: Secondary | ICD-10-CM | POA: Diagnosis not present

## 2015-08-03 DIAGNOSIS — L821 Other seborrheic keratosis: Secondary | ICD-10-CM | POA: Diagnosis not present

## 2015-08-03 DIAGNOSIS — D1800 Hemangioma unspecified site: Secondary | ICD-10-CM | POA: Diagnosis not present

## 2015-08-03 DIAGNOSIS — D225 Melanocytic nevi of trunk: Secondary | ICD-10-CM | POA: Diagnosis not present

## 2015-08-03 DIAGNOSIS — L814 Other melanin hyperpigmentation: Secondary | ICD-10-CM | POA: Diagnosis not present

## 2015-08-18 DIAGNOSIS — F4323 Adjustment disorder with mixed anxiety and depressed mood: Secondary | ICD-10-CM | POA: Diagnosis not present

## 2015-08-22 DIAGNOSIS — F902 Attention-deficit hyperactivity disorder, combined type: Secondary | ICD-10-CM | POA: Diagnosis not present

## 2015-08-22 DIAGNOSIS — F913 Oppositional defiant disorder: Secondary | ICD-10-CM | POA: Diagnosis not present

## 2015-08-29 DIAGNOSIS — F4323 Adjustment disorder with mixed anxiety and depressed mood: Secondary | ICD-10-CM | POA: Diagnosis not present

## 2015-09-21 DIAGNOSIS — N83299 Other ovarian cyst, unspecified side: Secondary | ICD-10-CM | POA: Diagnosis not present

## 2015-09-21 MED FILL — FLUTICASONE PROP 50 MCG SPR: 50 | 30 days supply | Qty: 16 | Fill #0

## 2015-10-02 DIAGNOSIS — F4323 Adjustment disorder with mixed anxiety and depressed mood: Secondary | ICD-10-CM | POA: Diagnosis not present

## 2015-10-12 DIAGNOSIS — F4323 Adjustment disorder with mixed anxiety and depressed mood: Secondary | ICD-10-CM | POA: Diagnosis not present

## 2015-11-10 DIAGNOSIS — F4323 Adjustment disorder with mixed anxiety and depressed mood: Secondary | ICD-10-CM | POA: Diagnosis not present

## 2015-12-05 DIAGNOSIS — F4323 Adjustment disorder with mixed anxiety and depressed mood: Secondary | ICD-10-CM | POA: Diagnosis not present

## 2015-12-12 DIAGNOSIS — F4323 Adjustment disorder with mixed anxiety and depressed mood: Secondary | ICD-10-CM | POA: Diagnosis not present

## 2015-12-26 ENCOUNTER — Other Ambulatory Visit: Payer: Self-pay | Admitting: Obstetrics and Gynecology

## 2015-12-26 DIAGNOSIS — Z1231 Encounter for screening mammogram for malignant neoplasm of breast: Secondary | ICD-10-CM

## 2016-01-09 DIAGNOSIS — F4323 Adjustment disorder with mixed anxiety and depressed mood: Secondary | ICD-10-CM | POA: Diagnosis not present

## 2016-01-11 DIAGNOSIS — Z0000 Encounter for general adult medical examination without abnormal findings: Secondary | ICD-10-CM | POA: Diagnosis not present

## 2016-01-12 ENCOUNTER — Encounter: Payer: Self-pay | Admitting: Genetic Counselor

## 2016-01-12 ENCOUNTER — Telehealth: Payer: 59 | Admitting: Family

## 2016-01-12 DIAGNOSIS — J309 Allergic rhinitis, unspecified: Secondary | ICD-10-CM

## 2016-01-12 MED ORDER — FLUTICASONE PROPIONATE 50 MCG/ACT NA SUSP: 2.0000 | Freq: Every day | NASAL | 6 refills | Status: DC

## 2016-01-12 MED ORDER — BENZONATATE 100 MG PO CAPS: 100.0000 mg | ORAL_CAPSULE | Freq: Three times a day (TID) | ORAL | 0 refills | Status: DC | PRN

## 2016-01-12 MED FILL — FLUTICASONE PROP 50 MCG SPR: 50 | 30 days supply | Qty: 16 | Fill #0

## 2016-01-12 MED FILL — BENZONATATE 100 MG CAPSULE: 100 | 5 days supply | Qty: 30 | Fill #0

## 2016-01-12 NOTE — Progress Notes (Signed)
E visit for Allergic Rhinitis We are sorry that you are not feeling well.  Her is how we plan to help!  Based on what you have shared with me it looks like you have Allergic Rhinitis.  Rhinitis is when a reaction occurs that causes nasal congestion, runny nose, sneezing, and itching.  Most types of rhinitis are caused by an inflammation and are associated with symptoms in the eyes ears or throat. There are several types of rhinitis.  The most common are acute rhinitis, which is usually caused by a viral illness, allergic or seasonal rhinitis, and nonallergic or year-round rhinitis.  Nasal allergies occur certain times of the year.  Allergic rhinitis is caused when allergens in the air trigger the release of histamine in the body.  Histamine causes itching, swelling, and fluid to build up in the fragile linings of the nasal passages, sinuses and eyelids.  An itchy nose and clear discharge are common.  I recommend the following over the counter treatments: (Continue taking your claritin)  I also would recommend a nasal spray: Flonase 2 sprays into each nostril once daily  You may also benefit from eye drops such as: Systane 1-2 driops each eye twice daily as needed   Additionally, due to your persistent cough, I am prescribing tessalon perles (100mg , take 1-2 every 8 hours as needed for cough). I know that you already doing the claritin and the Nasonex (similar to the Flonase). I am prescribing the flonase for you to use as your insurance may cover it that way and you may potentially save some cost. Please use only 1 nasal spray with the exception of adding a plain saline nasal spray to the flonase. If your symptoms continue to persist with this combination, you may need to seek an allergy specialist.   HOME CARE:   You can use an over-the-counter saline nasal spray as needed  Avoid areas where there is heavy dust, mites, or molds  Stay indoors on windy days during the pollen season  Keep  windows closed in home, at least in bedroom; use air conditioner.  Use high-efficiency house air filter  Keep windows closed in car, turn AC on re-circulate  Avoid playing out with dog during pollen season  GET HELP RIGHT AWAY IF:   If your symptoms do not improve within 10 days  You become short of breath  You develop yellow or green discharge from your nose for over 3 days  You have coughing fits  MAKE SURE YOU:   Understand these instructions  Will watch your condition  Will get help right away if you are not doing well or get worse  Thank you for choosing an e-visit. Your e-visit answers were reviewed by a board certified advanced clinical practitioner to complete your personal care plan. Depending upon the condition, your plan could have included both over the counter or prescription medications. Please review your pharmacy choice. Be sure that the pharmacy you have chosen is open so that you can pick up your prescription now.  If there is a problem you may message your provider in [place] to have the prescription routed to another pharmacy. Your safety is important to [place]. If you have drug allergies check your prescription carefully.  For the next 24 hours, you can use MyChart to ask questions about today's visit, request a non-urgent call back, or ask for a work or school excuse from your e-visit provider. You will get an email in the next two days asking  about your experience. I hope that your e-visit has been valuable and will speed your recovery.

## 2016-01-30 ENCOUNTER — Ambulatory Visit
Admission: RE | Admit: 2016-01-30 | Discharge: 2016-01-30 | Disposition: A | Discharge status: Home / Self Care | Payer: 59 | Source: Ambulatory Visit | Attending: Obstetrics and Gynecology | Admitting: Obstetrics and Gynecology

## 2016-01-30 DIAGNOSIS — Z1231 Encounter for screening mammogram for malignant neoplasm of breast: Secondary | ICD-10-CM

## 2016-02-01 DIAGNOSIS — Z0000 Encounter for general adult medical examination without abnormal findings: Secondary | ICD-10-CM | POA: Diagnosis not present

## 2016-02-06 DIAGNOSIS — F4323 Adjustment disorder with mixed anxiety and depressed mood: Secondary | ICD-10-CM | POA: Diagnosis not present

## 2016-02-27 DIAGNOSIS — Z01419 Encounter for gynecological examination (general) (routine) without abnormal findings: Secondary | ICD-10-CM | POA: Diagnosis not present

## 2016-02-27 DIAGNOSIS — Z6826 Body mass index (BMI) 26.0-26.9, adult: Secondary | ICD-10-CM | POA: Diagnosis not present

## 2016-03-19 DIAGNOSIS — F4323 Adjustment disorder with mixed anxiety and depressed mood: Secondary | ICD-10-CM | POA: Diagnosis not present

## 2016-03-26 MED FILL — traZODone HCL 50 MG TABS: 50 | 30 days supply | Qty: 15 | Fill #0

## 2016-05-02 DIAGNOSIS — F4323 Adjustment disorder with mixed anxiety and depressed mood: Secondary | ICD-10-CM | POA: Diagnosis not present

## 2016-06-06 DIAGNOSIS — H52223 Regular astigmatism, bilateral: Secondary | ICD-10-CM | POA: Diagnosis not present

## 2016-06-06 DIAGNOSIS — H5201 Hypermetropia, right eye: Secondary | ICD-10-CM | POA: Diagnosis not present

## 2016-06-06 DIAGNOSIS — H524 Presbyopia: Secondary | ICD-10-CM | POA: Diagnosis not present

## 2016-06-08 ENCOUNTER — Telehealth: Payer: 59 | Admitting: Nurse Practitioner

## 2016-06-08 DIAGNOSIS — J309 Allergic rhinitis, unspecified: Secondary | ICD-10-CM | POA: Diagnosis not present

## 2016-06-08 MED ORDER — FLUTICASONE PROPIONATE 50 MCG/ACT NA SUSP: 2.0000 | Freq: Every day | NASAL | 6 refills | Status: AC

## 2016-06-08 NOTE — Progress Notes (Signed)
E visit for Allergic Rhinitis We are sorry that you are not feeling well.  Her is how we plan to help!  Based on what you have shared with me it looks like you have Allergic Rhinitis.  Rhinitis is when a reaction occurs that causes nasal congestion, runny nose, sneezing, and itching.  Most types of rhinitis are caused by an inflammation and are associated with symptoms in the eyes ears or throat. There are several types of rhinitis.  The most common are acute rhinitis, which is usually caused by a viral illness, allergic or seasonal rhinitis, and nonallergic or year-round rhinitis.  Nasal allergies occur certain times of the year.  Allergic rhinitis is caused when allergens in the air trigger the release of histamine in the body.  Histamine causes itching, swelling, and fluid to build up in the fragile linings of the nasal passages, sinuses and eyelids.  An itchy nose and clear discharge are common.  I recommend the following over the counter treatments: Clarinex 5 mg take 1 tablet daily 9Cannot take if pregnant or breastfeeding)  I also would recommend a nasal spray: Flonase 2 sprays into each nostril once daily    HOME CARE:   You can use an over-the-counter saline nasal spray as needed  Avoid areas where there is heavy dust, mites, or molds  Stay indoors on windy days during the pollen season  Keep windows closed in home, at least in bedroom; use air conditioner.  Use high-efficiency house air filter  Keep windows closed in car, turn AC on re-circulate  Avoid playing out with dog during pollen season  GET HELP RIGHT AWAY IF:   If your symptoms do not improve within 10 days  You become short of breath  You develop yellow or green discharge from your nose for over 3 days  You have coughing fits  MAKE SURE YOU:   Understand these instructions  Will watch your condition  Will get help right away if you are not doing well or get worse  Thank you for choosing an  e-visit. Your e-visit answers were reviewed by a board certified advanced clinical practitioner to complete your personal care plan. Depending upon the condition, your plan could have included both over the counter or prescription medications. Please review your pharmacy choice. Be sure that the pharmacy you have chosen is open so that you can pick up your prescription now.  If there is a problem you may message your provider in MyChart to have the prescription routed to another pharmacy. Your safety is important to us. If you have drug allergies check your prescription carefully.  For the next 24 hours, you can use MyChart to ask questions about today's visit, request a non-urgent call back, or ask for a work or school excuse from your e-visit provider. You will get an email in the next two days asking about your experience. I hope that your e-visit has been valuable and will speed your recovery.         

## 2016-06-26 DIAGNOSIS — F4323 Adjustment disorder with mixed anxiety and depressed mood: Secondary | ICD-10-CM | POA: Diagnosis not present

## 2016-07-25 MED FILL — SERTRALINE HCL 50 MG TABLET: 50 | 60 days supply | Qty: 30 | Fill #0

## 2016-08-08 DIAGNOSIS — D225 Melanocytic nevi of trunk: Secondary | ICD-10-CM | POA: Diagnosis not present

## 2016-08-08 DIAGNOSIS — L821 Other seborrheic keratosis: Secondary | ICD-10-CM | POA: Diagnosis not present

## 2016-08-08 DIAGNOSIS — L723 Sebaceous cyst: Secondary | ICD-10-CM | POA: Diagnosis not present

## 2016-08-08 DIAGNOSIS — L814 Other melanin hyperpigmentation: Secondary | ICD-10-CM | POA: Diagnosis not present

## 2016-08-08 DIAGNOSIS — D1800 Hemangioma unspecified site: Secondary | ICD-10-CM | POA: Diagnosis not present

## 2016-08-22 DIAGNOSIS — F419 Anxiety disorder, unspecified: Secondary | ICD-10-CM | POA: Diagnosis not present

## 2016-08-22 DIAGNOSIS — R41840 Attention and concentration deficit: Secondary | ICD-10-CM | POA: Diagnosis not present

## 2016-08-28 MED FILL — SERTRALINE HCL 50 MG TABLET: 50 | 90 days supply | Qty: 90 | Fill #0

## 2016-10-31 MED FILL — SERTRALINE HCL 100 MG TAB: 100 | 90 days supply | Qty: 90 | Fill #0

## 2016-12-12 DIAGNOSIS — R635 Abnormal weight gain: Secondary | ICD-10-CM | POA: Diagnosis not present

## 2016-12-12 DIAGNOSIS — L209 Atopic dermatitis, unspecified: Secondary | ICD-10-CM | POA: Diagnosis not present

## 2016-12-12 DIAGNOSIS — F419 Anxiety disorder, unspecified: Secondary | ICD-10-CM | POA: Diagnosis not present

## 2016-12-12 MED FILL — BETAMETHASONE DP 0.05% CRM: 0.05 | 30 days supply | Qty: 30 | Fill #0

## 2016-12-16 ENCOUNTER — Other Ambulatory Visit: Payer: Self-pay | Admitting: Obstetrics and Gynecology

## 2016-12-16 DIAGNOSIS — Z1231 Encounter for screening mammogram for malignant neoplasm of breast: Secondary | ICD-10-CM

## 2017-01-30 ENCOUNTER — Ambulatory Visit: Payer: 59

## 2017-02-11 DIAGNOSIS — F419 Anxiety disorder, unspecified: Secondary | ICD-10-CM | POA: Diagnosis not present

## 2017-02-11 DIAGNOSIS — Z0000 Encounter for general adult medical examination without abnormal findings: Secondary | ICD-10-CM | POA: Diagnosis not present

## 2017-02-25 ENCOUNTER — Ambulatory Visit: Payer: 59

## 2017-03-04 DIAGNOSIS — Z6827 Body mass index (BMI) 27.0-27.9, adult: Secondary | ICD-10-CM | POA: Diagnosis not present

## 2017-03-04 DIAGNOSIS — Z01419 Encounter for gynecological examination (general) (routine) without abnormal findings: Secondary | ICD-10-CM | POA: Diagnosis not present

## 2017-03-20 MED FILL — SERTRALINE HCL 100 MG TAB: 100 | 90 days supply | Qty: 90 | Fill #0

## 2017-03-27 ENCOUNTER — Ambulatory Visit: Payer: 59

## 2017-04-08 ENCOUNTER — Ambulatory Visit
Admission: RE | Admit: 2017-04-08 | Discharge: 2017-04-08 | Disposition: A | Discharge status: Home / Self Care | Payer: 59 | Source: Ambulatory Visit | Attending: Obstetrics and Gynecology | Admitting: Obstetrics and Gynecology

## 2017-04-08 ENCOUNTER — Ambulatory Visit: Payer: 59

## 2017-04-08 DIAGNOSIS — Z1231 Encounter for screening mammogram for malignant neoplasm of breast: Secondary | ICD-10-CM

## 2017-04-17 ENCOUNTER — Ambulatory Visit: Payer: 59

## 2017-04-29 ENCOUNTER — Inpatient Hospital Stay: Payer: 59 | Attending: Oncology

## 2017-04-29 ENCOUNTER — Other Ambulatory Visit: Payer: Self-pay | Admitting: Oncology

## 2017-04-29 ENCOUNTER — Other Ambulatory Visit: Payer: Self-pay

## 2017-04-29 DIAGNOSIS — R05 Cough: Secondary | ICD-10-CM | POA: Insufficient documentation

## 2017-04-29 DIAGNOSIS — Z23 Encounter for immunization: Secondary | ICD-10-CM

## 2017-04-29 DIAGNOSIS — R059 Cough, unspecified: Secondary | ICD-10-CM

## 2017-04-29 DIAGNOSIS — J111 Influenza due to unidentified influenza virus with other respiratory manifestations: Secondary | ICD-10-CM

## 2017-04-29 LAB — INFLUENZA PANEL BY PCR (TYPE A & B)
INFLBPCR: NEGATIVE
Influenza A By PCR: NEGATIVE

## 2017-04-29 NOTE — Progress Notes (Signed)
Tara Lopez is feeling pretty miserable.  She has a cough.  I am going ahead and getting a swab to rule out flu on her

## 2017-04-30 DIAGNOSIS — J988 Other specified respiratory disorders: Secondary | ICD-10-CM | POA: Diagnosis not present

## 2017-04-30 MED FILL — OSELTAMIVIR PHOSPHATE 75 MG: 75 | 5 days supply | Qty: 10 | Fill #0

## 2017-04-30 MED FILL — PROMETHAZINE W/COD SYRUP: 6.25-10 | 6 days supply | Qty: 120 | Fill #0

## 2017-07-17 DIAGNOSIS — H5201 Hypermetropia, right eye: Secondary | ICD-10-CM | POA: Diagnosis not present

## 2017-07-17 DIAGNOSIS — H524 Presbyopia: Secondary | ICD-10-CM | POA: Diagnosis not present

## 2017-07-17 DIAGNOSIS — H52223 Regular astigmatism, bilateral: Secondary | ICD-10-CM | POA: Diagnosis not present

## 2017-07-29 DIAGNOSIS — K449 Diaphragmatic hernia without obstruction or gangrene: Secondary | ICD-10-CM | POA: Diagnosis not present

## 2017-07-29 DIAGNOSIS — R1310 Dysphagia, unspecified: Secondary | ICD-10-CM | POA: Diagnosis not present

## 2017-07-29 DIAGNOSIS — K5900 Constipation, unspecified: Secondary | ICD-10-CM | POA: Diagnosis not present

## 2017-07-29 DIAGNOSIS — Z1211 Encounter for screening for malignant neoplasm of colon: Secondary | ICD-10-CM | POA: Diagnosis not present

## 2017-08-14 DIAGNOSIS — L723 Sebaceous cyst: Secondary | ICD-10-CM | POA: Diagnosis not present

## 2017-08-14 DIAGNOSIS — D1800 Hemangioma unspecified site: Secondary | ICD-10-CM | POA: Diagnosis not present

## 2017-08-14 DIAGNOSIS — L821 Other seborrheic keratosis: Secondary | ICD-10-CM | POA: Diagnosis not present

## 2017-08-14 DIAGNOSIS — L309 Dermatitis, unspecified: Secondary | ICD-10-CM | POA: Diagnosis not present

## 2017-08-14 DIAGNOSIS — L814 Other melanin hyperpigmentation: Secondary | ICD-10-CM | POA: Diagnosis not present

## 2017-08-14 DIAGNOSIS — D225 Melanocytic nevi of trunk: Secondary | ICD-10-CM | POA: Diagnosis not present

## 2017-08-14 MED FILL — TRIAMCINOLONE 0.1% OINTMENT: 0.1 | 14 days supply | Qty: 60 | Fill #0

## 2017-09-05 DIAGNOSIS — Z1211 Encounter for screening for malignant neoplasm of colon: Secondary | ICD-10-CM | POA: Diagnosis not present

## 2017-12-02 MED FILL — SERTRALINE HCL 100 MG TAB: 100 | 90 days supply | Qty: 90 | Fill #0

## 2018-04-03 ENCOUNTER — Other Ambulatory Visit: Payer: Self-pay | Admitting: Obstetrics and Gynecology

## 2018-04-03 DIAGNOSIS — Z1231 Encounter for screening mammogram for malignant neoplasm of breast: Secondary | ICD-10-CM

## 2018-04-09 ENCOUNTER — Ambulatory Visit
Admission: RE | Admit: 2018-04-09 | Discharge: 2018-04-09 | Disposition: A | Discharge status: Home / Self Care | Payer: 59 | Source: Ambulatory Visit

## 2018-04-09 DIAGNOSIS — Z1231 Encounter for screening mammogram for malignant neoplasm of breast: Secondary | ICD-10-CM | POA: Diagnosis not present

## 2018-04-23 DIAGNOSIS — Z6827 Body mass index (BMI) 27.0-27.9, adult: Secondary | ICD-10-CM | POA: Diagnosis not present

## 2018-04-23 DIAGNOSIS — R1032 Left lower quadrant pain: Secondary | ICD-10-CM | POA: Diagnosis not present

## 2018-04-23 DIAGNOSIS — Z01419 Encounter for gynecological examination (general) (routine) without abnormal findings: Secondary | ICD-10-CM | POA: Diagnosis not present

## 2018-04-23 DIAGNOSIS — L282 Other prurigo: Secondary | ICD-10-CM | POA: Diagnosis not present

## 2018-05-12 DIAGNOSIS — Z0000 Encounter for general adult medical examination without abnormal findings: Secondary | ICD-10-CM | POA: Diagnosis not present

## 2018-05-12 DIAGNOSIS — L209 Atopic dermatitis, unspecified: Secondary | ICD-10-CM | POA: Diagnosis not present

## 2018-05-12 DIAGNOSIS — J309 Allergic rhinitis, unspecified: Secondary | ICD-10-CM | POA: Diagnosis not present

## 2018-05-14 DIAGNOSIS — B353 Tinea pedis: Secondary | ICD-10-CM | POA: Diagnosis not present

## 2018-05-14 DIAGNOSIS — L723 Sebaceous cyst: Secondary | ICD-10-CM | POA: Diagnosis not present

## 2018-05-14 DIAGNOSIS — Z23 Encounter for immunization: Secondary | ICD-10-CM | POA: Diagnosis not present

## 2018-05-14 DIAGNOSIS — L570 Actinic keratosis: Secondary | ICD-10-CM | POA: Diagnosis not present

## 2018-05-14 DIAGNOSIS — L719 Rosacea, unspecified: Secondary | ICD-10-CM | POA: Diagnosis not present

## 2018-05-14 MED FILL — NAFTIN 2 % GEL: 2 | 14 days supply | Qty: 60 | Fill #0

## 2018-05-26 DIAGNOSIS — Z13220 Encounter for screening for lipoid disorders: Secondary | ICD-10-CM | POA: Diagnosis not present

## 2018-05-26 DIAGNOSIS — J309 Allergic rhinitis, unspecified: Secondary | ICD-10-CM | POA: Diagnosis not present

## 2018-05-26 DIAGNOSIS — L209 Atopic dermatitis, unspecified: Secondary | ICD-10-CM | POA: Diagnosis not present

## 2018-05-26 DIAGNOSIS — Z0000 Encounter for general adult medical examination without abnormal findings: Secondary | ICD-10-CM | POA: Diagnosis not present

## 2018-08-27 DIAGNOSIS — L723 Sebaceous cyst: Secondary | ICD-10-CM | POA: Diagnosis not present

## 2018-08-27 DIAGNOSIS — L821 Other seborrheic keratosis: Secondary | ICD-10-CM | POA: Diagnosis not present

## 2018-08-27 DIAGNOSIS — L719 Rosacea, unspecified: Secondary | ICD-10-CM | POA: Diagnosis not present

## 2018-08-27 DIAGNOSIS — D225 Melanocytic nevi of trunk: Secondary | ICD-10-CM | POA: Diagnosis not present

## 2018-08-27 DIAGNOSIS — L814 Other melanin hyperpigmentation: Secondary | ICD-10-CM | POA: Diagnosis not present

## 2018-08-27 MED FILL — metroNIDAZOLE 0.75 % CREA: 0.75 | 20 days supply | Qty: 45 | Fill #0

## 2018-09-03 DIAGNOSIS — F4322 Adjustment disorder with anxiety: Secondary | ICD-10-CM | POA: Diagnosis not present

## 2018-09-04 ENCOUNTER — Emergency Department (HOSPITAL_COMMUNITY): Payer: 59

## 2018-09-04 ENCOUNTER — Other Ambulatory Visit: Payer: Self-pay

## 2018-09-04 ENCOUNTER — Encounter (HOSPITAL_COMMUNITY): Payer: Self-pay

## 2018-09-04 ENCOUNTER — Emergency Department (HOSPITAL_COMMUNITY)
Admission: EM | Admit: 2018-09-04 | Discharge: 2018-09-05 | Disposition: A | Discharge status: Home / Self Care | Payer: 59 | Attending: Emergency Medicine | Admitting: Emergency Medicine

## 2018-09-04 DIAGNOSIS — X58XXXA Exposure to other specified factors, initial encounter: Secondary | ICD-10-CM | POA: Diagnosis not present

## 2018-09-04 DIAGNOSIS — Y929 Unspecified place or not applicable: Secondary | ICD-10-CM | POA: Insufficient documentation

## 2018-09-04 DIAGNOSIS — Z20828 Contact with and (suspected) exposure to other viral communicable diseases: Secondary | ICD-10-CM | POA: Insufficient documentation

## 2018-09-04 DIAGNOSIS — Y999 Unspecified external cause status: Secondary | ICD-10-CM | POA: Insufficient documentation

## 2018-09-04 DIAGNOSIS — Y939 Activity, unspecified: Secondary | ICD-10-CM | POA: Insufficient documentation

## 2018-09-04 DIAGNOSIS — T18128A Food in esophagus causing other injury, initial encounter: Secondary | ICD-10-CM | POA: Insufficient documentation

## 2018-09-04 DIAGNOSIS — Z03818 Encounter for observation for suspected exposure to other biological agents ruled out: Secondary | ICD-10-CM | POA: Diagnosis not present

## 2018-09-04 LAB — SARS CORONAVIRUS 2 BY RT PCR (HOSPITAL ORDER, PERFORMED IN ~~LOC~~ HOSPITAL LAB): SARS Coronavirus 2: NEGATIVE

## 2018-09-04 MED ORDER — GLUCAGON HCL RDNA (DIAGNOSTIC) 1 MG IJ SOLR
1.0000 mg | Freq: Once | INTRAMUSCULAR | Status: AC
  Administered 2018-09-04: 1 mg via INTRAVENOUS
  Filled 2018-09-04: qty 1

## 2018-09-04 MED ORDER — MORPHINE SULFATE (PF) 4 MG/ML IV SOLN
4.0000 mg | Freq: Once | INTRAVENOUS | Status: AC
  Administered 2018-09-04: 4 mg via INTRAVENOUS
  Filled 2018-09-04: qty 1

## 2018-09-04 NOTE — ED Notes (Signed)
Patient transported to X-ray via stretcher 

## 2018-09-04 NOTE — Discharge Instructions (Signed)
Please schedule a follow-up appointment with your GI doctor.  I have given you information on soft food eating plan.  Please do not eat bread or meat as these are more likely to get stuck in your esophagus.  Please follow this eating plan until your GI doctor tells you otherwise.  Today you received medications that may make you sleepy or impair your ability to make decisions.  For the next 24 hours please do not drive, operate heavy machinery, care for a small child with out another adult present, or perform any activities that may cause harm to you or someone else if you were to fall asleep or be impaired.

## 2018-09-04 NOTE — ED Triage Notes (Signed)
Pt reports that she has some chicken stuck in her throat. States that her throat feels tight and she is unable to tolerate PO liquids. She states that she had a similar episode about 12 years ago that resulted in a tear in her esophagus. Denies SOB, but reports R subscapular back pain.

## 2018-09-04 NOTE — ED Notes (Signed)
Patient had a large amount of thick, food-based emesis. Patient stated that she feels like the blockage is gone completely and she feels much better. PO challenged with a few sips of water to ensure patient can hold down PO fluids.

## 2018-09-04 NOTE — ED Provider Notes (Signed)
[facility] DEPT Provider Note   CSN: [id number] Arrival date & time: 09/04/18  2000    History   Chief Complaint Chief Complaint  Patient presents with  . Food Bolus    HPI Tara Lopez is a 53 y.o. female.     HPI  53 year old female with a history of GERD and esophageal food impaction presents with concern for food impaction.  Patient reports that she was eating chicken approximately 1 hour ago at 730, and felt the chicken got stuck.  Points to an area of her mid upper chest where she feels a sensation of pressure and impaction.  Reports that she has been unable to swallow since this happened.  Reports that she swallows, and quickly regurgitate saliva, and has been frequently spitting.  Reports that it feels "uncomfortable" but denies any significant pain.  Reports that she does have a sensation of pressure both in her esophagus and feels some in her back.  Denies shortness of breath, other chest pain, vomiting, diarrhea, fevers.  Denies any other symptoms  She had a food impaction 12 years ago and reports she had a small tear in her esophagus at that time.  She says that this occurred because of her history of GERD.  She sees Dr. Collene Mares GI for GERD/dysphagia.  Past Medical History:  Diagnosis Date  . GERD (gastroesophageal reflux disease)     Patient Active Problem List   Diagnosis Date Noted  . Cough 04/29/2017  . WRIST PAIN, RIGHT 02/21/2009  . GANGLION CYST, WRIST, RIGHT 02/21/2009  . ECZEMA, HANDS 10/31/2006  . KNEE PAIN, RIGHT 10/31/2006  . SPRAIN/STRAIN, WRIST NOS 08/16/2005    Past Surgical History:  Procedure Laterality Date  . CESAREAN SECTION     x3  . OVARIAN CYST SURGERY    . TUBAL LIGATION       OB History   No obstetric history on file.      Home Medications    Prior to Admission medications   Medication Sig Start Date End Date Taking? Authorizing Provider  cetirizine (ZYRTEC) 10 MG tablet Take 10 mg by mouth  daily.   Yes [provider]  desoximetasone (TOPICORT) 0.05 % cream Apply 1 application topically 2 (two) times daily.   Yes [provider]  fluticasone (FLONASE) 50 MCG/ACT nasal spray Place 2 sprays into both nostrils daily. 06/08/16  Yes Martin, Mary-Margaret, FNP  Multiple Vitamin (MULTIVITAMIN WITH MINERALS) TABS tablet Take 1 tablet by mouth daily.   Yes [provider]  Probiotic Product (PROBIOTIC FORMULA PO) Take 1 tablet by mouth daily.   Yes [provider]  benzonatate (TESSALON PERLES) 100 MG capsule Take 1-2 capsules (100-200 mg total) by mouth every 8 (eight) hours as needed for cough. Patient not taking: Reported on 09/04/2018 01/12/16   Withrow, Elyse Jarvis, FNP  cephALEXin (KEFLEX) 500 MG capsule Take 1 capsule (500 mg total) by mouth 4 (four) times daily. Patient not taking: Reported on 09/04/2018 06/22/14   Melony Overly, MD  fluticasone [facility]) 50 MCG/ACT nasal spray Place 2 sprays into both nostrils daily. Patient not taking: Reported on 09/04/2018 01/12/16   Benjamine Mola, FNP    Family History History reviewed. No pertinent family history.  Social History Social History   Tobacco Use  . Smoking status: Never Smoker  Substance Use Topics  . Alcohol use: Yes    Alcohol/week: 7.0 standard drinks    Types: 7 Glasses of wine per week  Comment: 1 glass wine daily  . Drug use: No     Allergies   Other   Review of Systems Review of Systems  Constitutional: Negative for fever.  HENT: Positive for trouble swallowing. Negative for sore throat.   Eyes: Negative for visual disturbance.  Respiratory: Negative for cough and shortness of breath.   Cardiovascular: Positive for chest pain.  Gastrointestinal: Negative for abdominal pain, nausea and vomiting.  Musculoskeletal: Positive for back pain. Negative for neck pain.  Skin: Negative for rash.  Neurological: Negative for syncope and headaches.     Physical Exam Updated Vital  Signs BP (!) 151/86 (BP Location: Left Arm)   Pulse 86   Temp 99 F (37.2 C) (Oral)   Resp 20   SpO2 97%   Physical Exam Vitals signs and nursing note reviewed.  Constitutional:      General: She is not in acute distress.    Appearance: She is well-developed. She is not diaphoretic.  HENT:     Head: Normocephalic and atraumatic.  Eyes:     Conjunctiva/sclera: Conjunctivae normal.  Neck:     Musculoskeletal: Normal range of motion.  Cardiovascular:     Rate and Rhythm: Normal rate and regular rhythm.  Pulmonary:     Effort: Pulmonary effort is normal. No respiratory distress.  Abdominal:     Tenderness: There is no abdominal tenderness.  Musculoskeletal:        General: No tenderness.  Skin:    General: Skin is warm and dry.     Findings: No erythema or rash.  Neurological:     Mental Status: She is alert and oriented to person, place, and time.      ED Treatments / Results  Labs (all labs ordered are listed, but only abnormal results are displayed) Labs Reviewed  SARS CORONAVIRUS 2 (HOSPITAL ORDER, [facility])    EKG None  Radiology Dg Chest 2 View  Result Date: 09/04/2018 CLINICAL DATA:  Food impaction EXAM: CHEST - 2 VIEW COMPARISON:  None. FINDINGS: The heart size and mediastinal contours are within normal limits. Both lungs are clear. The visualized skeletal structures are unremarkable. IMPRESSION: No active cardiopulmonary disease. Electronically Signed   By: Donavan Foil M.D.   On: 09/04/2018 20:31    Procedures Procedures (including critical care time)  Medications Ordered in ED Medications  glucagon (human recombinant) (GLUCAGEN) injection 1 mg (1 mg Intravenous Given 09/04/18 2030)  glucagon (human recombinant) (GLUCAGEN) injection 1 mg (1 mg Intravenous Given 09/04/18 2112)  morphine 4 MG/ML injection 4 mg (4 mg Intravenous Given 09/04/18 2219)     Initial Impression / Assessment and Plan / ED Course  I have reviewed  the triage vital signs and the nursing notes.  Pertinent labs & imaging results that were available during my care of the patient were reviewed by me and considered in my medical decision making (see chart for details).        53 year old female with a history of GERD and esophageal food impaction presents with concern for food impaction.  She is hemodynamically stable.  Chest x-ray shows no abnormalities.  She was given glucagon 1 mg x 2 with continued regurgitation of saliva and symptoms of food impaction.  Gastroenterology, Dr. Fuller Plan who is on-call for Dr. Collene Mares, was consulted.  We are currently awaiting COVID 19 testing prior to endoscopy. Care signed out to [facility] with COVID testing pending.     Final Clinical Impressions(s) / ED Diagnoses  Final diagnoses:  Food impaction of esophagus, initial encounter    ED Discharge Orders    None       Gareth Morgan, MD 09/04/18 2238

## 2018-09-05 SURGERY — EGD (ESOPHAGOGASTRODUODENOSCOPY)
Anesthesia: Moderate Sedation

## 2018-09-08 DIAGNOSIS — H5203 Hypermetropia, bilateral: Secondary | ICD-10-CM | POA: Diagnosis not present

## 2018-09-08 DIAGNOSIS — H524 Presbyopia: Secondary | ICD-10-CM | POA: Diagnosis not present

## 2018-09-08 DIAGNOSIS — H52223 Regular astigmatism, bilateral: Secondary | ICD-10-CM | POA: Diagnosis not present

## 2018-09-17 DIAGNOSIS — F4322 Adjustment disorder with anxiety: Secondary | ICD-10-CM | POA: Diagnosis not present

## 2018-10-08 ENCOUNTER — Telehealth: Payer: Self-pay | Admitting: Plastic Surgery

## 2018-10-08 DIAGNOSIS — F4322 Adjustment disorder with anxiety: Secondary | ICD-10-CM | POA: Diagnosis not present

## 2018-10-08 NOTE — Telephone Encounter (Signed)

## 2018-10-09 ENCOUNTER — Encounter: Payer: Self-pay | Admitting: Plastic Surgery

## 2018-10-09 ENCOUNTER — Other Ambulatory Visit: Payer: Self-pay

## 2018-10-09 ENCOUNTER — Ambulatory Visit: Payer: 59 | Admitting: Plastic Surgery

## 2018-10-09 DIAGNOSIS — L723 Sebaceous cyst: Secondary | ICD-10-CM | POA: Diagnosis not present

## 2018-10-09 NOTE — Progress Notes (Signed)
Patient ID: Tara Lopez, female    DOB: Sep 03, 1965, 53 y.o.   MRN: [id number]   Chief Complaint  Patient presents with  . Skin Problem    The patient is a 53 year old female here for evaluation of a left cheek lesion.  She is in good health.  She noticed a growing firm area under her skin on the left lower cheek area.  She has a tendency to manipulate it a fair bit.  She felt like it was just a cyst and most likely popped it on the inside.  It has started to grow again.  It is tender and has some discoloration of the outer skin.  It does not appear infected and she does not have any other concerning skin lesions.   Review of Systems  Constitutional: Negative for activity change and appetite change.  HENT: Negative for congestion.   Eyes: Negative.   Respiratory: Negative for chest tightness and shortness of breath.   Gastrointestinal: Negative.   Endocrine: Negative.   Genitourinary: Negative.   Musculoskeletal: Negative.   Skin: Positive for color change.  Hematological: Negative.   Psychiatric/Behavioral: Negative.     Past Medical History:  Diagnosis Date  . GERD (gastroesophageal reflux disease)     Past Surgical History:  Procedure Laterality Date  . CESAREAN SECTION     x3  . OVARIAN CYST SURGERY    . TUBAL LIGATION        Current Outpatient Medications:  .  cetirizine (ZYRTEC) 10 MG tablet, Take 10 mg by mouth daily., Disp: , Rfl:  .  desoximetasone (TOPICORT) 0.05 % cream, Apply 1 application topically 2 (two) times daily., Disp: , Rfl:  .  fluticasone (FLONASE) 50 MCG/ACT nasal spray, Place 2 sprays into both nostrils daily., Disp: 16 g, Rfl: 6 .  fluticasone (FLONASE) 50 MCG/ACT nasal spray, Place 2 sprays into both nostrils daily., Disp: 16 g, Rfl: 6 .  Multiple Vitamin (MULTIVITAMIN WITH MINERALS) TABS tablet, Take 1 tablet by mouth daily., Disp: , Rfl:  .  Probiotic Product (PROBIOTIC FORMULA PO), Take 1 tablet by mouth daily., Disp: , Rfl:  .   benzonatate (TESSALON PERLES) 100 MG capsule, Take 1-2 capsules (100-200 mg total) by mouth every 8 (eight) hours as needed for cough. (Patient not taking: Reported on 09/04/2018), Disp: 30 capsule, Rfl: 0 .  cephALEXin (KEFLEX) 500 MG capsule, Take 1 capsule (500 mg total) by mouth 4 (four) times daily. (Patient not taking: Reported on 09/04/2018), Disp: 28 capsule, Rfl: 0   Objective:   Vitals:   10/09/18 0811  BP: (!) 159/97  Pulse: 83  Temp: 98.4 F (36.9 C)  SpO2: 94%    Physical Exam Vitals signs and nursing note reviewed.  Constitutional:      Appearance: Normal appearance.  HENT:     Head: Normocephalic and atraumatic.   Eyes:     Extraocular Movements: Extraocular movements intact.     Pupils: Pupils are equal, round, and reactive to light.  Cardiovascular:     Rate and Rhythm: Normal rate.     Pulses: Normal pulses.  Pulmonary:     Effort: Pulmonary effort is normal. No respiratory distress.  Abdominal:     General: Abdomen is flat. There is no distension.  Neurological:     General: No focal deficit present.     Mental Status: She is alert and oriented to person, place, and time.  Psychiatric:        Mood and  Affect: Mood normal.        Behavior: Behavior normal.     Assessment & Plan:  Sebaceous cyst  Recommend excision of left cheek sebaceous cyst.  She is agreeable to doing this in the office.  She is aware there will be a scar. Pictures were obtained of the patient and placed in the chart with the patient's or guardian's permission.  [place], DO

## 2018-10-15 DIAGNOSIS — Z7189 Other specified counseling: Secondary | ICD-10-CM | POA: Diagnosis not present

## 2018-10-15 DIAGNOSIS — Z03818 Encounter for observation for suspected exposure to other biological agents ruled out: Secondary | ICD-10-CM | POA: Diagnosis not present

## 2018-11-05 DIAGNOSIS — F4322 Adjustment disorder with anxiety: Secondary | ICD-10-CM | POA: Diagnosis not present

## 2018-11-06 ENCOUNTER — Encounter: Payer: Self-pay | Admitting: Plastic Surgery

## 2018-11-06 ENCOUNTER — Other Ambulatory Visit (HOSPITAL_COMMUNITY)
Admission: RE | Admit: 2018-11-06 | Discharge: 2018-11-06 | Disposition: A | Discharge status: Home / Self Care | Payer: 59 | Source: Ambulatory Visit | Attending: Plastic Surgery | Admitting: Plastic Surgery

## 2018-11-06 ENCOUNTER — Ambulatory Visit: Payer: 59 | Admitting: Plastic Surgery

## 2018-11-06 ENCOUNTER — Other Ambulatory Visit: Payer: Self-pay

## 2018-11-06 VITALS — BP 152/88 | HR 75 | Temp 97.7°F | Ht 66.0 in | Wt 171.4 lb

## 2018-11-06 DIAGNOSIS — L723 Sebaceous cyst: Secondary | ICD-10-CM | POA: Insufficient documentation

## 2018-11-06 DIAGNOSIS — L720 Epidermal cyst: Secondary | ICD-10-CM | POA: Diagnosis not present

## 2018-11-06 NOTE — Progress Notes (Signed)
Preoperative Dx: sebaceous cyst left cheek  Postoperative Dx: Same  Procedure: excision of sebaceous cyst left cheek 1 cm  Surgeon: Dr. Lyndee Leo Karisma Meiser  Anesthesia: Lidocaine 1% with 1:100,000 epinepherine  Description of Procedure: Risks and complications were explained to the patient.  Consent was confirmed.  Time out was called and all information was confirmed to be correct.  The area was prepped with betadine and drapped.  Lidocaine 1% with epinepherine was injected in the subcutaneous area.  After waiting several minutes for the lidocaine to take affect a #15 blade was used to incise the skin over the lesion.  The scissors were used to dissect around the lesion and remove the entire sac.  It looked like a cyst.  A 5-0 Monocryl was used to close the skin edges.  Steri strips were applied.  The patient is to follow up in one week.  She tolerated the procedure well and there were no complications. The specimen was sent to pathology.

## 2018-11-09 ENCOUNTER — Telehealth: Payer: Self-pay

## 2018-11-09 NOTE — Telephone Encounter (Signed)
Call to pt- n/a-left v/m [facility]

## 2018-11-10 MED FILL — BETAMETHASONE DIPROPIONATE: 0.05 | 15 days supply | Qty: 15 | Fill #0

## 2018-11-13 ENCOUNTER — Encounter: Payer: Self-pay | Admitting: Surgical

## 2018-11-13 ENCOUNTER — Ambulatory Visit: Payer: 59 | Admitting: Surgical

## 2018-11-13 ENCOUNTER — Other Ambulatory Visit: Payer: Self-pay

## 2018-11-13 VITALS — BP 144/97 | HR 74 | Temp 97.8°F | Ht 66.5 in | Wt 170.6 lb

## 2018-11-13 DIAGNOSIS — L723 Sebaceous cyst: Secondary | ICD-10-CM

## 2018-11-13 NOTE — Progress Notes (Signed)
   Subjective:     Patient ID: Tara Lopez, female    DOB: Feb 25, 1966, 53 y.o.   MRN: [id number]  Chief Complaint  Patient presents with  . Follow-up    1 week for sebaceous cyst on (L) lower face    HPI: The patient is a 53 y.o. female here for follow-up after excision of sebaceous cyst of left cheek 7 days ago.  Pathology showed benign epidermoid cyst.   BP (!) 144/97 (BP Location: Left Arm, Patient Position: Sitting, Cuff Size: Normal)   Pulse 74   Temp 97.8 F (36.6 C) (Temporal)   Ht 5' 6.5" (1.689 m)   Wt 170 lb 9.6 oz (77.4 kg)   SpO2 99%   BMI 27.12 kg/m   Sutures removed. Incision c/d/i. Healing nicely. She has trouble with steri-strips so we did not place an additional one today. No rash noted.   Follow up as needed.

## 2018-11-19 DIAGNOSIS — F4322 Adjustment disorder with anxiety: Secondary | ICD-10-CM | POA: Diagnosis not present

## 2018-11-26 MED FILL — FLUTICASONE PROP 50 MCG SPR: 50 | 60 days supply | Qty: 16 | Fill #0

## 2018-12-03 DIAGNOSIS — F4322 Adjustment disorder with anxiety: Secondary | ICD-10-CM | POA: Diagnosis not present

## 2018-12-10 NOTE — Progress Notes (Signed)
   Subjective:     Patient ID: Tara Lopez, female    DOB: May 14, 1965, 53 y.o.   MRN: [id number]  Chief Complaint  Patient presents with  . Follow-up    on sebaceous on (L) jaw    HPI: The patient is a 53 y.o. female here for follow-up after excision of sebaceous cyst ~ 1 month ago. She was last seen on 11/13/18 and was doing well.   Today she feels as if the cyst has come back and also had some questions about laser removal of the redness. She says the redness has been there for about 1 year and has never resolved.  It does not appear infected. No fever, chills, n/v. No pain.  Review of Systems  Constitutional: Negative.   Respiratory: Negative.   Cardiovascular: Negative.   Genitourinary: Negative.   Musculoskeletal: Negative.   Skin: Negative for itching and rash.       + lesion  Neurological: Negative.      Objective:   Vital Signs BP (!) 166/96 (BP Location: Right Arm, Patient Position: Sitting, Cuff Size: Normal)   Pulse 80   Temp (!) 96.9 F (36.1 C) (Temporal)   Ht 5\' 6"  (1.676 m)   Wt 170 lb 6.4 oz (77.3 kg)   SpO2 97%   BMI 27.50 kg/m  Vital Signs and Nursing Note Reviewed  Physical Exam  Constitutional: She is oriented to person, place, and time and well-developed, well-nourished, and in no distress.  HENT:  Head: Normocephalic and atraumatic.    Cardiovascular: Normal rate.  Pulmonary/Chest: Effort normal.  Musculoskeletal: Normal range of motion.  Neurological: She is alert and oriented to person, place, and time. Gait normal.  Skin: Skin is warm and dry. No rash noted. She is not diaphoretic. No erythema. No pallor.  Psychiatric: Mood and affect normal.      Assessment/Plan:     ICD-10-CM   1. Sebaceous cyst  L72.3    Plan for follow up for removal of recurrent cyst in 2-3 weeks.  Pictures were obtained of the patient and placed in the chart with the patient's or guardian's permission.    Carola Rhine Niylah Hassan, PA-C 12/11/2018, 8:48 AM

## 2018-12-11 ENCOUNTER — Ambulatory Visit (INDEPENDENT_AMBULATORY_CARE_PROVIDER_SITE_OTHER): Payer: 59 | Admitting: Surgical

## 2018-12-11 ENCOUNTER — Encounter: Payer: Self-pay | Admitting: Surgical

## 2018-12-11 ENCOUNTER — Other Ambulatory Visit: Payer: Self-pay

## 2018-12-11 VITALS — BP 166/96 | HR 80 | Temp 96.9°F | Ht 66.0 in | Wt 170.4 lb

## 2018-12-11 DIAGNOSIS — L723 Sebaceous cyst: Secondary | ICD-10-CM

## 2018-12-17 DIAGNOSIS — F4322 Adjustment disorder with anxiety: Secondary | ICD-10-CM | POA: Diagnosis not present

## 2018-12-23 DIAGNOSIS — J309 Allergic rhinitis, unspecified: Secondary | ICD-10-CM | POA: Diagnosis not present

## 2018-12-23 MED FILL — MONTELUKAST SOD 10 MG TAB: 10 | 30 days supply | Qty: 30 | Fill #0

## 2018-12-29 ENCOUNTER — Encounter: Payer: Self-pay | Admitting: Plastic Surgery

## 2018-12-29 ENCOUNTER — Ambulatory Visit: Payer: 59 | Admitting: Plastic Surgery

## 2018-12-29 ENCOUNTER — Other Ambulatory Visit: Payer: Self-pay

## 2018-12-29 VITALS — BP 165/90 | HR 85 | Temp 97.1°F | Ht 66.0 in | Wt 173.6 lb

## 2018-12-29 DIAGNOSIS — L723 Sebaceous cyst: Secondary | ICD-10-CM

## 2018-12-29 NOTE — Progress Notes (Signed)
Procedure Note  Preoperative Dx: sebaceous cyst left cheek  Postoperative Dx: Same  Procedure: Excision of sebaceous cyst left cheek  Anesthesia: Lidocaine 1% with 1:100,000 epinepherine   Description of Procedure: Risks and complications were explained to the patient.  Consent was confirmed and the patient understands the risks and benefits.  The potential complications and alternatives were explained and the patient consents.  The patient expressed understanding the option of not having the procedure and the risks of a scar.  Time out was called and all information was confirmed to be correct.    The area was prepped and drapped.  Lidocaine 1% with epinepherine was injected in the subcutaneous area.  After waiting several minutes for the local to take affect a #15 blade was used to excise the area in an eliptical pattern.  A 6-0 Monocryl was used to close the skin edges.  A dressing was applied.  The patient was given instructions on how to care for the area and a follow up appointment.  Tara Lopez tolerated the procedure well and there were no complications.

## 2018-12-31 DIAGNOSIS — F4322 Adjustment disorder with anxiety: Secondary | ICD-10-CM | POA: Diagnosis not present

## 2019-01-08 ENCOUNTER — Ambulatory Visit: Payer: 59 | Admitting: Surgical

## 2019-01-08 ENCOUNTER — Encounter: Payer: Self-pay | Admitting: Surgical

## 2019-01-08 ENCOUNTER — Other Ambulatory Visit: Payer: Self-pay

## 2019-01-08 VITALS — BP 147/94 | HR 76 | Temp 98.4°F | Ht 66.0 in | Wt 174.2 lb

## 2019-01-08 DIAGNOSIS — L723 Sebaceous cyst: Secondary | ICD-10-CM

## 2019-01-08 NOTE — Progress Notes (Signed)
   Subjective:     Patient ID: Tara Lopez, female    DOB: 09-30-1965, 53 y.o.   MRN: [id number]  Chief Complaint  Patient presents with  . Follow-up    10 days on sebaceous cyst    HPI: The patient is a 53 y.o. female here for follow-up after re-excision of cheek cyst.  She is doing well.  No sign of seroma, hematoma, infection.  Sutures in place along left cheek.  No dehiscence noted.  No pain.  She denies any fever, chills, nausea, vomiting.  Review of Systems  Constitutional: Negative for activity change, chills, diaphoresis, fatigue and fever.  Respiratory: Negative for shortness of breath.   Cardiovascular: Negative for chest pain.  Skin: Positive for color change. Negative for pallor, rash and wound.  Neurological: Negative for dizziness, facial asymmetry and headaches.     Objective:   Vital Signs BP (!) 147/94 (BP Location: Left Arm, Patient Position: Sitting, Cuff Size: Normal)   Pulse 76   Temp 98.4 F (36.9 C) (Oral)   Ht 5\' 6"  (1.676 m)   Wt 174 lb 3.2 oz (79 kg)   SpO2 98%   BMI 28.12 kg/m  Vital Signs and Nursing Note Reviewed  Physical Exam  Constitutional: She is oriented to person, place, and time and well-developed, well-nourished, and in no distress.  HENT:  Head: Normocephalic and atraumatic.    Cardiovascular: Normal rate.  Pulmonary/Chest: Effort normal.  Musculoskeletal: Normal range of motion.  Neurological: She is alert and oriented to person, place, and time. Gait normal.  Skin: Skin is warm and dry. No rash noted. She is not diaphoretic. There is erythema (Periwound, irritation). No pallor.  Psychiatric: Mood and affect normal.      Assessment/Plan:     ICD-10-CM   1. Sebaceous cyst  L72.3     Suture removed.  Incision is C/C/I.  Healing well.  No sign of infection, seroma, hematoma.  Follow-up in 5 weeks for laser of left cheek incision due to redness.  Patient also interested in possible laser for rosacea.  Call with any  questions or concerns.  Carola Rhine Sheikh Leverich, PA-C 01/08/2019, 8:13 AM

## 2019-01-14 DIAGNOSIS — F4322 Adjustment disorder with anxiety: Secondary | ICD-10-CM | POA: Diagnosis not present

## 2019-01-29 MED FILL — MONTELUKAST SOD 10 MG TAB: 10 | 30 days supply | Qty: 30 | Fill #0

## 2019-02-04 DIAGNOSIS — F4322 Adjustment disorder with anxiety: Secondary | ICD-10-CM | POA: Diagnosis not present

## 2019-02-16 ENCOUNTER — Ambulatory Visit (INDEPENDENT_AMBULATORY_CARE_PROVIDER_SITE_OTHER): Payer: Self-pay

## 2019-02-16 ENCOUNTER — Other Ambulatory Visit: Payer: Self-pay

## 2019-02-16 DIAGNOSIS — Z719 Counseling, unspecified: Secondary | ICD-10-CM

## 2019-02-23 DIAGNOSIS — F4322 Adjustment disorder with anxiety: Secondary | ICD-10-CM | POA: Diagnosis not present

## 2019-03-08 DIAGNOSIS — Z719 Counseling, unspecified: Secondary | ICD-10-CM | POA: Insufficient documentation

## 2019-03-08 NOTE — Progress Notes (Signed)
Preoperative Dx: scar of left cheek  Postoperative Dx:  same  Procedure: laser to cheek   Anesthesia: none  Description of Procedure:  Risks and complications were explained to the patient. Consent was confirmed and signed. Time out was called and all information was confirmed to be correct. The area  area was prepped with alcohol and wiped dry. The IPL laser was set at 7 J/cm2. The cheek was lasered. The patient tolerated the procedure well and there were no complications. The patient is to follow up in 4 weeks.

## 2019-03-15 DIAGNOSIS — Z03818 Encounter for observation for suspected exposure to other biological agents ruled out: Secondary | ICD-10-CM | POA: Diagnosis not present

## 2019-03-25 DIAGNOSIS — F4322 Adjustment disorder with anxiety: Secondary | ICD-10-CM | POA: Diagnosis not present

## 2019-04-08 DIAGNOSIS — F4322 Adjustment disorder with anxiety: Secondary | ICD-10-CM | POA: Diagnosis not present

## 2019-04-21 ENCOUNTER — Other Ambulatory Visit: Payer: Self-pay | Admitting: Obstetrics and Gynecology

## 2019-04-21 DIAGNOSIS — Z1231 Encounter for screening mammogram for malignant neoplasm of breast: Secondary | ICD-10-CM

## 2019-04-22 ENCOUNTER — Ambulatory Visit
Admission: RE | Admit: 2019-04-22 | Discharge: 2019-04-22 | Disposition: A | Discharge status: Home / Self Care | Payer: 59 | Source: Ambulatory Visit | Attending: Obstetrics and Gynecology | Admitting: Obstetrics and Gynecology

## 2019-04-22 ENCOUNTER — Other Ambulatory Visit: Payer: Self-pay

## 2019-04-22 DIAGNOSIS — Z1231 Encounter for screening mammogram for malignant neoplasm of breast: Secondary | ICD-10-CM

## 2019-04-24 MED FILL — MONTELUKAST SOD 10 MG TAB: 10 | 30 days supply | Qty: 30 | Fill #1

## 2019-04-26 ENCOUNTER — Other Ambulatory Visit: Payer: Self-pay | Admitting: Obstetrics and Gynecology

## 2019-04-26 DIAGNOSIS — R928 Other abnormal and inconclusive findings on diagnostic imaging of breast: Secondary | ICD-10-CM

## 2019-04-27 DIAGNOSIS — F4322 Adjustment disorder with anxiety: Secondary | ICD-10-CM | POA: Diagnosis not present

## 2019-04-29 ENCOUNTER — Ambulatory Visit
Admission: RE | Admit: 2019-04-29 | Discharge: 2019-04-29 | Disposition: A | Discharge status: Home / Self Care | Payer: 59 | Source: Ambulatory Visit | Attending: Obstetrics and Gynecology | Admitting: Obstetrics and Gynecology

## 2019-04-29 ENCOUNTER — Other Ambulatory Visit: Payer: Self-pay

## 2019-04-29 DIAGNOSIS — R928 Other abnormal and inconclusive findings on diagnostic imaging of breast: Secondary | ICD-10-CM

## 2019-04-29 DIAGNOSIS — N6002 Solitary cyst of left breast: Secondary | ICD-10-CM | POA: Diagnosis not present

## 2019-05-06 DIAGNOSIS — Z6827 Body mass index (BMI) 27.0-27.9, adult: Secondary | ICD-10-CM | POA: Diagnosis not present

## 2019-05-06 DIAGNOSIS — Z01419 Encounter for gynecological examination (general) (routine) without abnormal findings: Secondary | ICD-10-CM | POA: Diagnosis not present

## 2019-05-13 DIAGNOSIS — F4322 Adjustment disorder with anxiety: Secondary | ICD-10-CM | POA: Diagnosis not present

## 2019-05-15 MED FILL — FLUTICASONE PROP 50 MCG SPR: 50 | 60 days supply | Qty: 16 | Fill #1

## 2019-05-25 DIAGNOSIS — R05 Cough: Secondary | ICD-10-CM | POA: Diagnosis not present

## 2019-05-25 DIAGNOSIS — Z23 Encounter for immunization: Secondary | ICD-10-CM | POA: Diagnosis not present

## 2019-05-25 DIAGNOSIS — J309 Allergic rhinitis, unspecified: Secondary | ICD-10-CM | POA: Diagnosis not present

## 2019-05-25 DIAGNOSIS — L209 Atopic dermatitis, unspecified: Secondary | ICD-10-CM | POA: Diagnosis not present

## 2019-05-25 DIAGNOSIS — Z0000 Encounter for general adult medical examination without abnormal findings: Secondary | ICD-10-CM | POA: Diagnosis not present

## 2019-05-28 DIAGNOSIS — F4322 Adjustment disorder with anxiety: Secondary | ICD-10-CM | POA: Diagnosis not present

## 2019-06-11 DIAGNOSIS — F4322 Adjustment disorder with anxiety: Secondary | ICD-10-CM | POA: Diagnosis not present

## 2019-06-22 MED FILL — MONTELUKAST SOD 10 MG TAB: 10 | 30 days supply | Qty: 30 | Fill #2

## 2019-07-02 DIAGNOSIS — F4322 Adjustment disorder with anxiety: Secondary | ICD-10-CM | POA: Diagnosis not present

## 2019-07-09 DIAGNOSIS — F4322 Adjustment disorder with anxiety: Secondary | ICD-10-CM | POA: Diagnosis not present

## 2019-07-19 DIAGNOSIS — Z03818 Encounter for observation for suspected exposure to other biological agents ruled out: Secondary | ICD-10-CM | POA: Diagnosis not present

## 2019-07-20 DIAGNOSIS — F4320 Adjustment disorder, unspecified: Secondary | ICD-10-CM | POA: Diagnosis not present

## 2019-07-22 DIAGNOSIS — F4322 Adjustment disorder with anxiety: Secondary | ICD-10-CM | POA: Diagnosis not present

## 2019-07-27 DIAGNOSIS — F4320 Adjustment disorder, unspecified: Secondary | ICD-10-CM | POA: Diagnosis not present

## 2019-08-24 DIAGNOSIS — H5203 Hypermetropia, bilateral: Secondary | ICD-10-CM | POA: Diagnosis not present

## 2019-08-24 DIAGNOSIS — H52223 Regular astigmatism, bilateral: Secondary | ICD-10-CM | POA: Diagnosis not present

## 2019-08-24 DIAGNOSIS — H524 Presbyopia: Secondary | ICD-10-CM | POA: Diagnosis not present

## 2019-08-31 DIAGNOSIS — D225 Melanocytic nevi of trunk: Secondary | ICD-10-CM | POA: Diagnosis not present

## 2019-08-31 DIAGNOSIS — B353 Tinea pedis: Secondary | ICD-10-CM | POA: Diagnosis not present

## 2019-08-31 DIAGNOSIS — L578 Other skin changes due to chronic exposure to nonionizing radiation: Secondary | ICD-10-CM | POA: Diagnosis not present

## 2019-08-31 DIAGNOSIS — L821 Other seborrheic keratosis: Secondary | ICD-10-CM | POA: Diagnosis not present

## 2019-08-31 DIAGNOSIS — L814 Other melanin hyperpigmentation: Secondary | ICD-10-CM | POA: Diagnosis not present

## 2019-08-31 MED FILL — KETOCONAZOLE 2% CREAM: 2 | 30 days supply | Qty: 60 | Fill #0

## 2019-09-21 DIAGNOSIS — F4320 Adjustment disorder, unspecified: Secondary | ICD-10-CM | POA: Diagnosis not present

## 2019-10-05 DIAGNOSIS — F4320 Adjustment disorder, unspecified: Secondary | ICD-10-CM | POA: Diagnosis not present

## 2019-10-19 DIAGNOSIS — F4320 Adjustment disorder, unspecified: Secondary | ICD-10-CM | POA: Diagnosis not present

## 2019-11-02 DIAGNOSIS — F4320 Adjustment disorder, unspecified: Secondary | ICD-10-CM | POA: Diagnosis not present

## 2019-12-07 DIAGNOSIS — F4320 Adjustment disorder, unspecified: Secondary | ICD-10-CM | POA: Diagnosis not present

## 2020-01-11 DIAGNOSIS — F4320 Adjustment disorder, unspecified: Secondary | ICD-10-CM | POA: Diagnosis not present

## 2020-02-07 DIAGNOSIS — F4320 Adjustment disorder, unspecified: Secondary | ICD-10-CM | POA: Diagnosis not present

## 2020-02-15 DIAGNOSIS — F4320 Adjustment disorder, unspecified: Secondary | ICD-10-CM | POA: Diagnosis not present

## 2020-02-21 DIAGNOSIS — F4320 Adjustment disorder, unspecified: Secondary | ICD-10-CM | POA: Diagnosis not present

## 2020-03-03 DIAGNOSIS — F4320 Adjustment disorder, unspecified: Secondary | ICD-10-CM | POA: Diagnosis not present

## 2020-03-18 ENCOUNTER — Ambulatory Visit (HOSPITAL_COMMUNITY): Admission: EM | Admit: 2020-03-18 | Discharge: 2020-03-18 | Discharge status: Against Medical Advice | Payer: 59

## 2020-03-18 ENCOUNTER — Telehealth: Payer: 59 | Admitting: Nurse Practitioner

## 2020-03-18 ENCOUNTER — Other Ambulatory Visit: Payer: Self-pay | Admitting: Nurse Practitioner

## 2020-03-18 ENCOUNTER — Other Ambulatory Visit: Payer: Self-pay

## 2020-03-18 DIAGNOSIS — M542 Cervicalgia: Secondary | ICD-10-CM | POA: Diagnosis not present

## 2020-03-18 DIAGNOSIS — M25519 Pain in unspecified shoulder: Secondary | ICD-10-CM | POA: Diagnosis not present

## 2020-03-18 MED ORDER — NAPROXEN 500 MG PO TABS: 500.0000 mg | ORAL_TABLET | Freq: Two times a day (BID) | ORAL | 1 refills | Status: DC

## 2020-03-18 MED ORDER — CYCLOBENZAPRINE HCL 10 MG PO TABS: 10.0000 mg | ORAL_TABLET | Freq: Three times a day (TID) | ORAL | 1 refills | Status: DC | PRN

## 2020-03-18 NOTE — Progress Notes (Signed)

## 2020-03-20 MED FILL — NAPROXEN 500 MG TABS: 500 | 30 days supply | Qty: 60 | Fill #0

## 2020-03-20 MED FILL — CYCLOBENZAPRINE HCL 10 MG T: 10 | 10 days supply | Qty: 30 | Fill #0

## 2020-04-10 ENCOUNTER — Other Ambulatory Visit: Payer: Self-pay | Admitting: Obstetrics and Gynecology

## 2020-04-10 DIAGNOSIS — Z1231 Encounter for screening mammogram for malignant neoplasm of breast: Secondary | ICD-10-CM

## 2020-04-14 ENCOUNTER — Other Ambulatory Visit: Payer: 59

## 2020-04-14 DIAGNOSIS — Z20822 Contact with and (suspected) exposure to covid-19: Secondary | ICD-10-CM | POA: Diagnosis not present

## 2020-04-16 LAB — NOVEL CORONAVIRUS, NAA: SARS-CoV-2, NAA: NOT DETECTED

## 2020-04-16 LAB — SARS-COV-2, NAA 2 DAY TAT

## 2020-05-11 DIAGNOSIS — Z6823 Body mass index (BMI) 23.0-23.9, adult: Secondary | ICD-10-CM | POA: Diagnosis not present

## 2020-05-11 DIAGNOSIS — N951 Menopausal and female climacteric states: Secondary | ICD-10-CM | POA: Diagnosis not present

## 2020-05-11 DIAGNOSIS — Z01419 Encounter for gynecological examination (general) (routine) without abnormal findings: Secondary | ICD-10-CM | POA: Diagnosis not present

## 2020-06-06 ENCOUNTER — Other Ambulatory Visit: Payer: Self-pay

## 2020-06-06 ENCOUNTER — Ambulatory Visit
Admission: RE | Admit: 2020-06-06 | Discharge: 2020-06-06 | Disposition: A | Discharge status: Home / Self Care | Payer: 59 | Source: Ambulatory Visit | Attending: Obstetrics and Gynecology | Admitting: Obstetrics and Gynecology

## 2020-06-06 DIAGNOSIS — Z1231 Encounter for screening mammogram for malignant neoplasm of breast: Secondary | ICD-10-CM

## 2020-06-08 ENCOUNTER — Other Ambulatory Visit (HOSPITAL_BASED_OUTPATIENT_CLINIC_OR_DEPARTMENT_OTHER): Payer: Self-pay

## 2020-06-13 ENCOUNTER — Other Ambulatory Visit (HOSPITAL_COMMUNITY): Payer: Self-pay | Admitting: Family Medicine

## 2020-06-13 DIAGNOSIS — Z0000 Encounter for general adult medical examination without abnormal findings: Secondary | ICD-10-CM | POA: Diagnosis not present

## 2020-06-13 DIAGNOSIS — Z13220 Encounter for screening for lipoid disorders: Secondary | ICD-10-CM | POA: Diagnosis not present

## 2020-06-13 DIAGNOSIS — Z1211 Encounter for screening for malignant neoplasm of colon: Secondary | ICD-10-CM | POA: Diagnosis not present

## 2020-06-13 DIAGNOSIS — F419 Anxiety disorder, unspecified: Secondary | ICD-10-CM | POA: Diagnosis not present

## 2020-06-13 DIAGNOSIS — L209 Atopic dermatitis, unspecified: Secondary | ICD-10-CM | POA: Diagnosis not present

## 2020-06-13 DIAGNOSIS — R030 Elevated blood-pressure reading, without diagnosis of hypertension: Secondary | ICD-10-CM | POA: Diagnosis not present

## 2020-06-13 DIAGNOSIS — Z23 Encounter for immunization: Secondary | ICD-10-CM | POA: Diagnosis not present

## 2020-06-13 MED FILL — ESCITALOPRAM 5 MG TABLET: 5 | 30 days supply | Qty: 30 | Fill #0

## 2020-06-19 DIAGNOSIS — F4322 Adjustment disorder with anxiety: Secondary | ICD-10-CM | POA: Diagnosis not present

## 2020-06-26 DIAGNOSIS — F4322 Adjustment disorder with anxiety: Secondary | ICD-10-CM | POA: Diagnosis not present

## 2020-07-11 ENCOUNTER — Other Ambulatory Visit (HOSPITAL_COMMUNITY): Payer: Self-pay

## 2020-07-11 DIAGNOSIS — F5102 Adjustment insomnia: Secondary | ICD-10-CM | POA: Diagnosis not present

## 2020-07-11 DIAGNOSIS — F419 Anxiety disorder, unspecified: Secondary | ICD-10-CM | POA: Diagnosis not present

## 2020-07-11 DIAGNOSIS — F4322 Adjustment disorder with anxiety: Secondary | ICD-10-CM | POA: Diagnosis not present

## 2020-07-11 DIAGNOSIS — R030 Elevated blood-pressure reading, without diagnosis of hypertension: Secondary | ICD-10-CM | POA: Diagnosis not present

## 2020-07-11 MED ORDER — ESCITALOPRAM OXALATE 5 MG PO TABS
5.0000 mg | ORAL_TABLET | Freq: Every day | ORAL | 1 refills | Status: DC
  Filled 2020-07-11: qty 90, 90d supply, fill #0
  Filled 2020-10-24: qty 90, 90d supply, fill #1

## 2020-07-11 MED ORDER — HYDROXYZINE HCL 25 MG PO TABS
25.0000 mg | ORAL_TABLET | Freq: Every evening | ORAL | 2 refills | Status: AC
  Filled 2020-07-11: qty 30, 30d supply, fill #0

## 2020-07-12 ENCOUNTER — Other Ambulatory Visit (HOSPITAL_COMMUNITY): Payer: Self-pay

## 2020-07-17 ENCOUNTER — Other Ambulatory Visit (HOSPITAL_COMMUNITY): Payer: Self-pay

## 2020-07-17 MED ORDER — CARESTART COVID-19 HOME TEST VI KIT
PACK | 0 refills | Status: DC
  Filled 2020-07-17: qty 4, 4d supply, fill #0

## 2020-07-20 DIAGNOSIS — F4322 Adjustment disorder with anxiety: Secondary | ICD-10-CM | POA: Diagnosis not present

## 2020-07-27 DIAGNOSIS — F4322 Adjustment disorder with anxiety: Secondary | ICD-10-CM | POA: Diagnosis not present

## 2020-08-10 DIAGNOSIS — F4322 Adjustment disorder with anxiety: Secondary | ICD-10-CM | POA: Diagnosis not present

## 2020-08-22 DIAGNOSIS — F4322 Adjustment disorder with anxiety: Secondary | ICD-10-CM | POA: Diagnosis not present

## 2020-09-05 DIAGNOSIS — H524 Presbyopia: Secondary | ICD-10-CM | POA: Diagnosis not present

## 2020-09-05 DIAGNOSIS — H5203 Hypermetropia, bilateral: Secondary | ICD-10-CM | POA: Diagnosis not present

## 2020-09-05 DIAGNOSIS — H52223 Regular astigmatism, bilateral: Secondary | ICD-10-CM | POA: Diagnosis not present

## 2020-09-07 DIAGNOSIS — F4322 Adjustment disorder with anxiety: Secondary | ICD-10-CM | POA: Diagnosis not present

## 2020-09-12 DIAGNOSIS — L821 Other seborrheic keratosis: Secondary | ICD-10-CM | POA: Diagnosis not present

## 2020-09-12 DIAGNOSIS — L309 Dermatitis, unspecified: Secondary | ICD-10-CM | POA: Diagnosis not present

## 2020-09-12 DIAGNOSIS — L578 Other skin changes due to chronic exposure to nonionizing radiation: Secondary | ICD-10-CM | POA: Diagnosis not present

## 2020-09-12 DIAGNOSIS — D225 Melanocytic nevi of trunk: Secondary | ICD-10-CM | POA: Diagnosis not present

## 2020-09-12 DIAGNOSIS — L820 Inflamed seborrheic keratosis: Secondary | ICD-10-CM | POA: Diagnosis not present

## 2020-09-12 DIAGNOSIS — L814 Other melanin hyperpigmentation: Secondary | ICD-10-CM | POA: Diagnosis not present

## 2020-09-21 DIAGNOSIS — F4322 Adjustment disorder with anxiety: Secondary | ICD-10-CM | POA: Diagnosis not present

## 2020-10-05 ENCOUNTER — Other Ambulatory Visit (HOSPITAL_COMMUNITY): Payer: Self-pay

## 2020-10-05 DIAGNOSIS — F4322 Adjustment disorder with anxiety: Secondary | ICD-10-CM | POA: Diagnosis not present

## 2020-10-10 ENCOUNTER — Other Ambulatory Visit (HOSPITAL_COMMUNITY): Payer: Self-pay

## 2020-10-10 MED ORDER — CARESTART COVID-19 HOME TEST VI KIT
PACK | 0 refills | Status: DC
  Filled 2020-10-10: qty 4, 4d supply, fill #0

## 2020-10-20 ENCOUNTER — Other Ambulatory Visit: Payer: Self-pay

## 2020-10-20 ENCOUNTER — Other Ambulatory Visit (HOSPITAL_BASED_OUTPATIENT_CLINIC_OR_DEPARTMENT_OTHER): Payer: Self-pay

## 2020-10-20 ENCOUNTER — Ambulatory Visit: Payer: 59 | Attending: Internal Medicine

## 2020-10-20 DIAGNOSIS — Z23 Encounter for immunization: Secondary | ICD-10-CM

## 2020-10-20 MED ORDER — COVID-19 MRNA VACC (MODERNA) 100 MCG/0.5ML IM SUSP
INTRAMUSCULAR | 0 refills | Status: DC
  Filled 2020-10-20: qty 0.25, 1d supply, fill #0

## 2020-10-20 NOTE — Progress Notes (Signed)
   Covid-19 Vaccination Clinic  Name:  Tara Lopez    MRN: [id number] DOB: Oct 14, 1965  10/20/2020  [name] was observed post Covid-19 immunization for 15 minutes without incident. She was provided with Vaccine Information Sheet and instruction to access the V-Safe system.   [name] was instructed to call 911 with any severe reactions post vaccine: Difficulty breathing  Swelling of face and throat  A fast heartbeat  A bad rash all over body  Dizziness and weakness   Immunizations Administered     Name Date Dose VIS Date Route   Moderna Covid-19 Booster Vaccine 10/20/2020 11:21 AM 0.25 mL 01/05/2020 Intramuscular   Manufacturer: Moderna   Lot: [id number]   [id number]

## 2020-10-25 ENCOUNTER — Other Ambulatory Visit (HOSPITAL_COMMUNITY): Payer: Self-pay

## 2020-10-30 DIAGNOSIS — F4322 Adjustment disorder with anxiety: Secondary | ICD-10-CM | POA: Diagnosis not present

## 2020-11-13 DIAGNOSIS — F4322 Adjustment disorder with anxiety: Secondary | ICD-10-CM | POA: Diagnosis not present

## 2020-11-27 DIAGNOSIS — F4322 Adjustment disorder with anxiety: Secondary | ICD-10-CM | POA: Diagnosis not present

## 2020-12-01 ENCOUNTER — Other Ambulatory Visit (HOSPITAL_COMMUNITY): Payer: Self-pay

## 2020-12-01 MED ORDER — CARESTART COVID-19 HOME TEST VI KIT
PACK | 0 refills | Status: DC
Start: 2020-12-01 — End: 2022-05-21
  Filled 2020-12-01: qty 4, 4d supply, fill #0

## 2020-12-14 DIAGNOSIS — F4322 Adjustment disorder with anxiety: Secondary | ICD-10-CM | POA: Diagnosis not present

## 2021-01-16 DIAGNOSIS — F4322 Adjustment disorder with anxiety: Secondary | ICD-10-CM | POA: Diagnosis not present

## 2021-02-01 DIAGNOSIS — F4322 Adjustment disorder with anxiety: Secondary | ICD-10-CM | POA: Diagnosis not present

## 2021-02-13 DIAGNOSIS — F4322 Adjustment disorder with anxiety: Secondary | ICD-10-CM | POA: Diagnosis not present

## 2021-02-19 ENCOUNTER — Encounter (HOSPITAL_BASED_OUTPATIENT_CLINIC_OR_DEPARTMENT_OTHER): Payer: Self-pay | Admitting: *Deleted

## 2021-02-19 ENCOUNTER — Other Ambulatory Visit: Payer: Self-pay

## 2021-02-19 ENCOUNTER — Emergency Department (HOSPITAL_COMMUNITY)
Admission: EM | Admit: 2021-02-19 | Discharge: 2021-02-19 | Discharge status: Against Medical Advice | Payer: 59 | Attending: Emergency Medicine | Admitting: Emergency Medicine

## 2021-02-19 DIAGNOSIS — W540XXA Bitten by dog, initial encounter: Secondary | ICD-10-CM | POA: Insufficient documentation

## 2021-02-19 DIAGNOSIS — Z5321 Procedure and treatment not carried out due to patient leaving prior to being seen by health care provider: Secondary | ICD-10-CM | POA: Insufficient documentation

## 2021-02-19 DIAGNOSIS — S61511A Laceration without foreign body of right wrist, initial encounter: Secondary | ICD-10-CM | POA: Insufficient documentation

## 2021-02-19 DIAGNOSIS — S50812A Abrasion of left forearm, initial encounter: Secondary | ICD-10-CM | POA: Insufficient documentation

## 2021-02-19 MED ORDER — IBUPROFEN 400 MG PO TABS
400.0000 mg | ORAL_TABLET | Freq: Once | ORAL | Status: AC | PRN
  Administered 2021-02-19: 400 mg via ORAL
  Filled 2021-02-19: qty 1

## 2021-02-19 NOTE — ED Triage Notes (Signed)
Pt arrives ambulatory to triage, c/o dog bite. Lac to the right wrist and upper left forearm area with abrasions. Dog's vaccinations are up to date. Pt states last tetanus > 10 years. Areas cleansed and dressing placed in triage.

## 2021-02-20 ENCOUNTER — Encounter: Payer: Self-pay | Admitting: Emergency Medicine

## 2021-02-20 ENCOUNTER — Emergency Department (INDEPENDENT_AMBULATORY_CARE_PROVIDER_SITE_OTHER)
Admission: EM | Admit: 2021-02-20 | Discharge: 2021-02-20 | Disposition: A | Discharge status: Home / Self Care | Payer: 59 | Source: Home / Self Care | Attending: Family Medicine | Admitting: Family Medicine

## 2021-02-20 ENCOUNTER — Emergency Department (HOSPITAL_BASED_OUTPATIENT_CLINIC_OR_DEPARTMENT_OTHER)
Admission: EM | Admit: 2021-02-20 | Discharge: 2021-02-20 | Disposition: A | Discharge status: Home / Self Care | Payer: 59 | Source: Home / Self Care

## 2021-02-20 ENCOUNTER — Other Ambulatory Visit (HOSPITAL_COMMUNITY): Payer: Self-pay

## 2021-02-20 DIAGNOSIS — Z23 Encounter for immunization: Secondary | ICD-10-CM | POA: Diagnosis not present

## 2021-02-20 DIAGNOSIS — S51832A Puncture wound without foreign body of left forearm, initial encounter: Secondary | ICD-10-CM

## 2021-02-20 DIAGNOSIS — S61501A Unspecified open wound of right wrist, initial encounter: Secondary | ICD-10-CM

## 2021-02-20 DIAGNOSIS — W540XXA Bitten by dog, initial encounter: Secondary | ICD-10-CM | POA: Diagnosis not present

## 2021-02-20 DIAGNOSIS — S51841A Puncture wound with foreign body of right forearm, initial encounter: Secondary | ICD-10-CM

## 2021-02-20 DIAGNOSIS — S61531A Puncture wound without foreign body of right wrist, initial encounter: Secondary | ICD-10-CM | POA: Diagnosis not present

## 2021-02-20 DIAGNOSIS — S51802A Unspecified open wound of left forearm, initial encounter: Secondary | ICD-10-CM | POA: Diagnosis not present

## 2021-02-20 MED ORDER — AMOXICILLIN-POT CLAVULANATE 875-125 MG PO TABS
1.0000 | ORAL_TABLET | Freq: Two times a day (BID) | ORAL | 0 refills | Status: DC
  Filled 2021-02-20: qty 14, 7d supply, fill #0

## 2021-02-20 MED ORDER — TETANUS-DIPHTH-ACELL PERTUSSIS 5-2.5-18.5 LF-MCG/0.5 IM SUSY
0.5000 mL | PREFILLED_SYRINGE | Freq: Once | INTRAMUSCULAR | Status: AC
  Administered 2021-02-20: 0.5 mL via INTRAMUSCULAR

## 2021-02-20 MED ORDER — ACETAMINOPHEN 325 MG PO TABS
650.0000 mg | ORAL_TABLET | Freq: Once | ORAL | Status: AC
  Administered 2021-02-20: 650 mg via ORAL

## 2021-02-20 MED ORDER — NAPROXEN SODIUM 550 MG PO TABS
550.0000 mg | ORAL_TABLET | Freq: Two times a day (BID) | ORAL | 0 refills | Status: DC
  Filled 2021-02-20: qty 9, 5d supply, fill #0
  Filled 2021-02-20: qty 21, 10d supply, fill #0

## 2021-02-20 NOTE — ED Provider Notes (Signed)
[facility]    CSN: [id number] Arrival date & time: 02/20/21  1026      History   Chief Complaint Chief Complaint  Patient presents with   Animal Bite    HPI Tara Lopez is a 55 y.o. female.   HPI  Patient is here for dog bite.  Her own dog bit her yesterday.  She is a got tangled up in a leash and the dog was alarmed.  The dog bit both of her arms.  She went to an emergency room last night but did not wait to be seen.  She needs a tetanus.  The dog's rabies vaccine is up-to-date.  Records were faxed and reviewed.  She washed her wounds, mostly, and is here for evaluation  Past Medical History:  Diagnosis Date   GERD (gastroesophageal reflux disease)     Patient Active Problem List   Diagnosis Date Noted   Encounter for counseling 03/08/2019   Sebaceous cyst 10/09/2018   Cough 04/29/2017   WRIST PAIN, RIGHT 02/21/2009   GANGLION CYST, WRIST, RIGHT 02/21/2009   ECZEMA, HANDS 10/31/2006   KNEE PAIN, RIGHT 10/31/2006   SPRAIN/STRAIN, WRIST NOS 08/16/2005    Past Surgical History:  Procedure Laterality Date   CESAREAN SECTION     x3   OVARIAN CYST SURGERY     TUBAL LIGATION      OB History   No obstetric history on file.      Home Medications    Prior to Admission medications   Medication Sig Start Date End Date Taking? Authorizing Provider  amoxicillin-clavulanate (AUGMENTIN) 875-125 MG tablet Take 1 tablet by mouth every 12 hours. 02/20/21  Yes Raylene Everts, MD  naproxen sodium [facility] DS) 550 MG tablet Take 1 tablet by mouth 2  times daily with a meal. 02/20/21  Yes Raylene Everts, MD  omeprazole (PRILOSEC) 40 MG capsule 1 capsule 30 minutes before morning meal 05/25/19  Yes [provider]  betamethasone dipropionate (DIPROLENE) 0.05 % cream Take as directed. 11/10/18   [provider]  cetirizine (ZYRTEC) 10 MG tablet Take 10 mg by mouth daily.    [provider]  Cholecalciferol (VITAMIN D) 50 MCG (2000  UT) tablet 1 tablet    [provider]  COVID-19 At Home Antigen Test [facility] COVID-19 HOME TEST) KIT Use as directed within package instructions. 12/01/20   Edmon Crape, RPH  COVID-19 mRNA vaccine, Moderna, 100 MCG/0.5ML injection Inject into the muscle. 10/20/20   Carlyle Basques, MD  cyclobenzaprine (FLEXERIL) 10 MG tablet TAKE 1 TABLET BY MOUTH 3 TIMES DAILY AS NEEDED FOR MUSCLE SPASMS Patient not taking: Reported on 02/20/2021 03/18/20 03/18/21  Chevis Pretty, FNP  desoximetasone (TOPICORT) 0.05 % cream Apply 1 application topically 2 (two) times daily.    [provider]  escitalopram (LEXAPRO) 5 MG tablet TAKE 1 TABLET BY MOUTH ONCE A DAY Patient not taking: Reported on 02/20/2021 06/13/20 06/13/21  Marda Stalker, PA-C  escitalopram (LEXAPRO) 5 MG tablet take 1 tablet by mouth Once a day 90 days Patient not taking: Reported on 02/20/2021 07/11/20     fluticasone (FLONASE) 50 MCG/ACT nasal spray Place 2 sprays into both nostrils daily. 06/08/16   Chevis Pretty, FNP  hydrOXYzine (ATARAX/VISTARIL) 25 MG tablet take 1 tablet by mouth at bedtime as needed Patient not taking: Reported on 02/20/2021 07/11/20     metroNIDAZOLE (METROCREAM) 0.75 % cream As needed. 08/27/18   [provider]  montelukast (SINGULAIR) 10 MG  tablet Take 1 tablet by mouth at bedtime. Patient not taking: Reported on 02/20/2021 12/23/18   [provider]  Multiple Vitamin (MULTIVITAMIN WITH MINERALS) TABS tablet Take 1 tablet by mouth daily.    [provider]  naproxen (NAPROSYN) 500 MG tablet TAKE 1 TABLET BY MOUTH 2 TIMES DAILY WITH MEALS 03/18/20 03/18/21  Chevis Pretty, FNP  Omega-3 Fatty Acids (FISH OIL) 1000 MG CAPS 1 capsule    [provider]  Probiotic Product (PROBIOTIC FORMULA PO) Take 1 tablet by mouth daily.    [provider]    Family History Family History  Problem Relation Age of Onset   Hypertension Mother    High  Cholesterol Mother     Social History Social History   Tobacco Use   Smoking status: Never   Smokeless tobacco: Never  Vaping Use   Vaping Use: Never used  Substance Use Topics   Alcohol use: Yes    Alcohol/week: 7.0 standard drinks    Types: 7 Glasses of wine per week    Comment: 1 glass wine daily   Drug use: No     Allergies   Other   Review of Systems Review of Systems  See HPI Physical Exam Triage Vital Signs ED Triage Vitals  Enc Vitals Group     BP 02/20/21 1155 (!) 148/101     Pulse Rate 02/20/21 1155 72     Resp 02/20/21 1155 16     Temp 02/20/21 1155 98.7 F (37.1 C)     Temp Source 02/20/21 1155 Oral     SpO2 02/20/21 1155 99 %     Weight 02/20/21 1156 154 lb 15.7 oz (70.3 kg)     Height --      Head Circumference --      Peak Flow --      Pain Score 02/20/21 1156 6     Pain Loc --      Pain Edu? --      Excl. in [place]? --    No data found.  Updated Vital Signs BP 136/88 (BP Location: Right Arm)   Pulse 72   Temp 98.7 F (37.1 C) (Oral)   Resp 16   Wt 70.3 kg   SpO2 99%   BMI 25.01 kg/m      Physical Exam Constitutional:      General: She is not in acute distress.    Appearance: Normal appearance. She is well-developed and normal weight.  HENT:     Head: Normocephalic and atraumatic.     Mouth/Throat:     Comments: Mask is in place Eyes:     Conjunctiva/sclera: Conjunctivae normal.     Pupils: Pupils are equal, round, and reactive to light.  Cardiovascular:     Rate and Rhythm: Normal rate.  Pulmonary:     Effort: Pulmonary effort is normal. No respiratory distress.  Abdominal:     General: There is no distension.     Palpations: Abdomen is soft.  Musculoskeletal:        General: Normal range of motion.     Cervical back: Normal range of motion.  Skin:    General: Skin is warm and dry.     Comments: There are multiple puncture wounds.  Both forearms.  The left forearm has a large bite on the volar surface encompassing the  whole half circumference of the arm with extensive bruising, multiple puncture wounds, 1 wound is gaping open.  We discussed Steri-Strip with the patient's  allergic to tape.  The right forearm has wounds that are closer to the wrist.  Again multiple punctures.  Open.  Draining.  There is tenderness and erythema around the wound on the volar wrist.  Neurological:     General: No focal deficit present.     Mental Status: She is alert.  Psychiatric:        Mood and Affect: Mood normal.        Behavior: Behavior normal.   There were at least a total of 12 puncturesFrom 2 separate bites  UC Treatments / Results  Labs (all labs ordered are listed, but only abnormal results are displayed) Labs Reviewed - No data to display  EKG   Radiology No results found.  Procedures Procedures (including critical care time)  Medications Ordered in UC Medications  acetaminophen (TYLENOL) tablet 650 mg (650 mg Oral Given 02/20/21 1219)  Tdap (BOOSTRIX) injection 0.5 mL (0.5 mLs Intramuscular Given 02/20/21 1219)    Initial Impression / Assessment and Plan / UC Course  I have reviewed the triage vital signs and the nursing notes.  Pertinent labs & imaging results that were available during my care of the patient were reviewed by me and considered in my medical decision making (see chart for details).     Reviewed wound care.  Antibiotics.  Pain management.Reasons for return.  Follow-up with primary care Final Clinical Impressions(s) / UC Diagnoses   Final diagnoses:  Dog bite, initial encounter  Open wound of left forearm with complication, initial encounter  Open wound of right wrist, initial encounter     Discharge Instructions      Wash areas daily, apply antibiotic and bandage Take antibiotic 2 times a day with food.  Take 2 doses today. Strong antibiotics and cough stomach upset.  I recommend that you take a probiotic while on Augmentin Take Anaprox 2 times a day for pain and  swelling See your doctor if not improving by the end of the week   ED Prescriptions     Medication Sig Dispense Auth. Provider   amoxicillin-clavulanate (AUGMENTIN) 875-125 MG tablet Take 1 tablet by mouth every 12 hours. 14 tablet Raylene Everts, MD   naproxen sodium (ANAPROX DS) 550 MG tablet Take 1 tablet by mouth 2  times daily with a meal. 30 tablet Raylene Everts, MD      PDMP not reviewed this encounter.   Raylene Everts, MD 02/20/21 [place]

## 2021-02-20 NOTE — ED Triage Notes (Signed)
Pt was bitten by her own dog yesterday  Will need an updated tDap  Here with her mom Bites noted to right wrist, left AC & left thumb Pt was running last night & dogs were tangled in the leashes, pt attempted to untangle dog who proceeded to bite as noted  Rabies vaccine up to date - due 05/2021- pt had records faxed to [place] noted at all sites Ibuprofen 200mg  this am

## 2021-02-20 NOTE — Discharge Instructions (Signed)
Wash areas daily, apply antibiotic and bandage Take antibiotic 2 times a day with food.  Take 2 doses today. Strong antibiotics and cough stomach upset.  I recommend that you take a probiotic while on Augmentin Take Anaprox 2 times a day for pain and swelling See your doctor if not improving by the end of the week

## 2021-02-21 ENCOUNTER — Other Ambulatory Visit (HOSPITAL_COMMUNITY): Payer: Self-pay

## 2021-02-22 ENCOUNTER — Other Ambulatory Visit (HOSPITAL_COMMUNITY): Payer: Self-pay

## 2021-03-09 DIAGNOSIS — F4322 Adjustment disorder with anxiety: Secondary | ICD-10-CM | POA: Diagnosis not present

## 2021-03-22 DIAGNOSIS — F4322 Adjustment disorder with anxiety: Secondary | ICD-10-CM | POA: Diagnosis not present

## 2021-03-23 ENCOUNTER — Other Ambulatory Visit (HOSPITAL_COMMUNITY): Payer: Self-pay

## 2021-03-23 MED ORDER — BUPROPION HCL ER (XL) 150 MG PO TB24
ORAL_TABLET | ORAL | 1 refills | Status: DC
  Filled 2021-03-23: qty 30, 30d supply, fill #0
  Filled 2021-04-24: qty 30, 30d supply, fill #1

## 2021-04-03 DIAGNOSIS — F4322 Adjustment disorder with anxiety: Secondary | ICD-10-CM | POA: Diagnosis not present

## 2021-04-17 DIAGNOSIS — F4322 Adjustment disorder with anxiety: Secondary | ICD-10-CM | POA: Diagnosis not present

## 2021-04-24 ENCOUNTER — Other Ambulatory Visit (HOSPITAL_COMMUNITY): Payer: Self-pay

## 2021-04-25 ENCOUNTER — Other Ambulatory Visit: Payer: Self-pay | Admitting: Obstetrics and Gynecology

## 2021-04-25 DIAGNOSIS — Z1231 Encounter for screening mammogram for malignant neoplasm of breast: Secondary | ICD-10-CM

## 2021-05-04 ENCOUNTER — Other Ambulatory Visit (HOSPITAL_COMMUNITY): Payer: Self-pay

## 2021-05-04 DIAGNOSIS — I10 Essential (primary) hypertension: Secondary | ICD-10-CM | POA: Diagnosis not present

## 2021-05-04 DIAGNOSIS — F419 Anxiety disorder, unspecified: Secondary | ICD-10-CM | POA: Diagnosis not present

## 2021-05-04 DIAGNOSIS — F5102 Adjustment insomnia: Secondary | ICD-10-CM | POA: Diagnosis not present

## 2021-05-04 MED ORDER — METOPROLOL SUCCINATE ER 25 MG PO TB24
ORAL_TABLET | ORAL | 1 refills | Status: DC
  Filled 2021-05-04: qty 30, 30d supply, fill #0
  Filled 2021-05-28: qty 30, 30d supply, fill #1

## 2021-05-15 DIAGNOSIS — F4322 Adjustment disorder with anxiety: Secondary | ICD-10-CM | POA: Diagnosis not present

## 2021-05-17 ENCOUNTER — Other Ambulatory Visit: Payer: Self-pay | Admitting: Obstetrics and Gynecology

## 2021-05-17 DIAGNOSIS — N644 Mastodynia: Secondary | ICD-10-CM | POA: Diagnosis not present

## 2021-05-17 DIAGNOSIS — Z124 Encounter for screening for malignant neoplasm of cervix: Secondary | ICD-10-CM | POA: Diagnosis not present

## 2021-05-17 DIAGNOSIS — Z6825 Body mass index (BMI) 25.0-25.9, adult: Secondary | ICD-10-CM | POA: Diagnosis not present

## 2021-05-17 DIAGNOSIS — Z01419 Encounter for gynecological examination (general) (routine) without abnormal findings: Secondary | ICD-10-CM | POA: Diagnosis not present

## 2021-05-28 ENCOUNTER — Other Ambulatory Visit (HOSPITAL_COMMUNITY): Payer: Self-pay

## 2021-05-28 MED ORDER — BUPROPION HCL ER (XL) 150 MG PO TB24
150.0000 mg | ORAL_TABLET | Freq: Every morning | ORAL | 2 refills | Status: AC
  Filled 2021-05-28: qty 30, 30d supply, fill #0
  Filled 2021-07-13: qty 30, 30d supply, fill #1
  Filled 2021-08-14: qty 30, 30d supply, fill #2

## 2021-06-07 ENCOUNTER — Ambulatory Visit
Admission: RE | Admit: 2021-06-07 | Discharge: 2021-06-07 | Disposition: A | Discharge status: Home / Self Care | Payer: 59 | Source: Ambulatory Visit | Attending: Obstetrics and Gynecology | Admitting: Obstetrics and Gynecology

## 2021-06-07 ENCOUNTER — Other Ambulatory Visit: Payer: Self-pay

## 2021-06-07 DIAGNOSIS — Z1231 Encounter for screening mammogram for malignant neoplasm of breast: Secondary | ICD-10-CM

## 2021-06-07 DIAGNOSIS — R928 Other abnormal and inconclusive findings on diagnostic imaging of breast: Secondary | ICD-10-CM | POA: Diagnosis not present

## 2021-06-07 DIAGNOSIS — N644 Mastodynia: Secondary | ICD-10-CM

## 2021-06-08 DIAGNOSIS — Z1231 Encounter for screening mammogram for malignant neoplasm of breast: Secondary | ICD-10-CM

## 2021-06-19 ENCOUNTER — Other Ambulatory Visit (HOSPITAL_COMMUNITY): Payer: Self-pay

## 2021-06-19 DIAGNOSIS — I10 Essential (primary) hypertension: Secondary | ICD-10-CM | POA: Diagnosis not present

## 2021-06-19 DIAGNOSIS — Z13220 Encounter for screening for lipoid disorders: Secondary | ICD-10-CM | POA: Diagnosis not present

## 2021-06-19 DIAGNOSIS — J309 Allergic rhinitis, unspecified: Secondary | ICD-10-CM | POA: Diagnosis not present

## 2021-06-19 DIAGNOSIS — Z1321 Encounter for screening for nutritional disorder: Secondary | ICD-10-CM | POA: Diagnosis not present

## 2021-06-19 DIAGNOSIS — F5102 Adjustment insomnia: Secondary | ICD-10-CM | POA: Diagnosis not present

## 2021-06-19 DIAGNOSIS — F419 Anxiety disorder, unspecified: Secondary | ICD-10-CM | POA: Diagnosis not present

## 2021-06-19 DIAGNOSIS — Z0000 Encounter for general adult medical examination without abnormal findings: Secondary | ICD-10-CM | POA: Diagnosis not present

## 2021-06-19 DIAGNOSIS — Z6825 Body mass index (BMI) 25.0-25.9, adult: Secondary | ICD-10-CM | POA: Diagnosis not present

## 2021-06-19 MED ORDER — MONTELUKAST SODIUM 10 MG PO TABS
ORAL_TABLET | ORAL | 1 refills | Status: DC
  Filled 2021-06-19: qty 90, 90d supply, fill #0
  Filled 2021-11-20: qty 90, 90d supply, fill #1

## 2021-06-20 ENCOUNTER — Other Ambulatory Visit (HOSPITAL_COMMUNITY): Payer: Self-pay

## 2021-06-20 MED ORDER — FLUTICASONE PROPIONATE 50 MCG/ACT NA SUSP
NASAL | 1 refills | Status: DC
  Filled 2021-06-20: qty 16, 60d supply, fill #0
  Filled 2021-11-20: qty 16, 60d supply, fill #1

## 2021-06-22 ENCOUNTER — Other Ambulatory Visit (HOSPITAL_COMMUNITY): Payer: Self-pay

## 2021-07-05 ENCOUNTER — Other Ambulatory Visit (HOSPITAL_COMMUNITY): Payer: Self-pay

## 2021-07-05 MED ORDER — METOPROLOL SUCCINATE ER 25 MG PO TB24
ORAL_TABLET | ORAL | 1 refills | Status: AC
  Filled 2021-07-05: qty 90, 90d supply, fill #0
  Filled 2021-12-05: qty 6, 6d supply, fill #1
  Filled 2021-12-05: qty 84, 84d supply, fill #1

## 2021-07-13 ENCOUNTER — Other Ambulatory Visit (HOSPITAL_COMMUNITY): Payer: Self-pay

## 2021-08-14 ENCOUNTER — Other Ambulatory Visit (HOSPITAL_COMMUNITY): Payer: Self-pay

## 2021-09-13 DIAGNOSIS — H52223 Regular astigmatism, bilateral: Secondary | ICD-10-CM | POA: Diagnosis not present

## 2021-09-13 DIAGNOSIS — H524 Presbyopia: Secondary | ICD-10-CM | POA: Diagnosis not present

## 2021-09-13 DIAGNOSIS — H5203 Hypermetropia, bilateral: Secondary | ICD-10-CM | POA: Diagnosis not present

## 2021-09-17 DIAGNOSIS — L578 Other skin changes due to chronic exposure to nonionizing radiation: Secondary | ICD-10-CM | POA: Diagnosis not present

## 2021-09-17 DIAGNOSIS — D229 Melanocytic nevi, unspecified: Secondary | ICD-10-CM | POA: Diagnosis not present

## 2021-09-17 DIAGNOSIS — L821 Other seborrheic keratosis: Secondary | ICD-10-CM | POA: Diagnosis not present

## 2021-09-17 DIAGNOSIS — D1801 Hemangioma of skin and subcutaneous tissue: Secondary | ICD-10-CM | POA: Diagnosis not present

## 2021-09-17 DIAGNOSIS — M25552 Pain in left hip: Secondary | ICD-10-CM | POA: Diagnosis not present

## 2021-09-17 DIAGNOSIS — I10 Essential (primary) hypertension: Secondary | ICD-10-CM | POA: Diagnosis not present

## 2021-09-17 DIAGNOSIS — M25551 Pain in right hip: Secondary | ICD-10-CM | POA: Diagnosis not present

## 2021-09-17 DIAGNOSIS — L814 Other melanin hyperpigmentation: Secondary | ICD-10-CM | POA: Diagnosis not present

## 2021-09-20 ENCOUNTER — Other Ambulatory Visit (HOSPITAL_COMMUNITY): Payer: Self-pay

## 2021-09-20 DIAGNOSIS — M25551 Pain in right hip: Secondary | ICD-10-CM | POA: Diagnosis not present

## 2021-09-20 DIAGNOSIS — M25552 Pain in left hip: Secondary | ICD-10-CM | POA: Diagnosis not present

## 2021-09-20 DIAGNOSIS — M25561 Pain in right knee: Secondary | ICD-10-CM | POA: Diagnosis not present

## 2021-09-20 MED ORDER — MELOXICAM 15 MG PO TABS
ORAL_TABLET | ORAL | 0 refills | Status: DC
  Filled 2021-09-20: qty 14, 14d supply, fill #0

## 2021-09-27 DIAGNOSIS — M25651 Stiffness of right hip, not elsewhere classified: Secondary | ICD-10-CM | POA: Diagnosis not present

## 2021-09-27 DIAGNOSIS — R262 Difficulty in walking, not elsewhere classified: Secondary | ICD-10-CM | POA: Diagnosis not present

## 2021-09-27 DIAGNOSIS — M25652 Stiffness of left hip, not elsewhere classified: Secondary | ICD-10-CM | POA: Diagnosis not present

## 2021-09-27 DIAGNOSIS — M25552 Pain in left hip: Secondary | ICD-10-CM | POA: Diagnosis not present

## 2021-09-27 DIAGNOSIS — M7061 Trochanteric bursitis, right hip: Secondary | ICD-10-CM | POA: Diagnosis not present

## 2021-09-27 DIAGNOSIS — M7062 Trochanteric bursitis, left hip: Secondary | ICD-10-CM | POA: Diagnosis not present

## 2021-09-27 DIAGNOSIS — M25551 Pain in right hip: Secondary | ICD-10-CM | POA: Diagnosis not present

## 2021-10-01 DIAGNOSIS — M7062 Trochanteric bursitis, left hip: Secondary | ICD-10-CM | POA: Diagnosis not present

## 2021-10-01 DIAGNOSIS — M25652 Stiffness of left hip, not elsewhere classified: Secondary | ICD-10-CM | POA: Diagnosis not present

## 2021-10-01 DIAGNOSIS — M25551 Pain in right hip: Secondary | ICD-10-CM | POA: Diagnosis not present

## 2021-10-01 DIAGNOSIS — M7061 Trochanteric bursitis, right hip: Secondary | ICD-10-CM | POA: Diagnosis not present

## 2021-10-01 DIAGNOSIS — R262 Difficulty in walking, not elsewhere classified: Secondary | ICD-10-CM | POA: Diagnosis not present

## 2021-10-01 DIAGNOSIS — M25552 Pain in left hip: Secondary | ICD-10-CM | POA: Diagnosis not present

## 2021-10-01 DIAGNOSIS — M25651 Stiffness of right hip, not elsewhere classified: Secondary | ICD-10-CM | POA: Diagnosis not present

## 2021-10-04 DIAGNOSIS — M25652 Stiffness of left hip, not elsewhere classified: Secondary | ICD-10-CM | POA: Diagnosis not present

## 2021-10-04 DIAGNOSIS — M7061 Trochanteric bursitis, right hip: Secondary | ICD-10-CM | POA: Diagnosis not present

## 2021-10-04 DIAGNOSIS — M7062 Trochanteric bursitis, left hip: Secondary | ICD-10-CM | POA: Diagnosis not present

## 2021-10-04 DIAGNOSIS — M25551 Pain in right hip: Secondary | ICD-10-CM | POA: Diagnosis not present

## 2021-10-04 DIAGNOSIS — M25651 Stiffness of right hip, not elsewhere classified: Secondary | ICD-10-CM | POA: Diagnosis not present

## 2021-10-04 DIAGNOSIS — R262 Difficulty in walking, not elsewhere classified: Secondary | ICD-10-CM | POA: Diagnosis not present

## 2021-10-04 DIAGNOSIS — M25552 Pain in left hip: Secondary | ICD-10-CM | POA: Diagnosis not present

## 2021-10-08 DIAGNOSIS — R262 Difficulty in walking, not elsewhere classified: Secondary | ICD-10-CM | POA: Diagnosis not present

## 2021-10-08 DIAGNOSIS — M25652 Stiffness of left hip, not elsewhere classified: Secondary | ICD-10-CM | POA: Diagnosis not present

## 2021-10-08 DIAGNOSIS — M25551 Pain in right hip: Secondary | ICD-10-CM | POA: Diagnosis not present

## 2021-10-08 DIAGNOSIS — M7062 Trochanteric bursitis, left hip: Secondary | ICD-10-CM | POA: Diagnosis not present

## 2021-10-08 DIAGNOSIS — M25552 Pain in left hip: Secondary | ICD-10-CM | POA: Diagnosis not present

## 2021-10-08 DIAGNOSIS — M7061 Trochanteric bursitis, right hip: Secondary | ICD-10-CM | POA: Diagnosis not present

## 2021-10-08 DIAGNOSIS — M25651 Stiffness of right hip, not elsewhere classified: Secondary | ICD-10-CM | POA: Diagnosis not present

## 2021-10-25 DIAGNOSIS — M25551 Pain in right hip: Secondary | ICD-10-CM | POA: Diagnosis not present

## 2021-10-25 DIAGNOSIS — M25561 Pain in right knee: Secondary | ICD-10-CM | POA: Diagnosis not present

## 2021-10-30 DIAGNOSIS — M25651 Stiffness of right hip, not elsewhere classified: Secondary | ICD-10-CM | POA: Diagnosis not present

## 2021-10-30 DIAGNOSIS — M25652 Stiffness of left hip, not elsewhere classified: Secondary | ICD-10-CM | POA: Diagnosis not present

## 2021-10-30 DIAGNOSIS — M25552 Pain in left hip: Secondary | ICD-10-CM | POA: Diagnosis not present

## 2021-10-30 DIAGNOSIS — M25551 Pain in right hip: Secondary | ICD-10-CM | POA: Diagnosis not present

## 2021-10-30 DIAGNOSIS — M7061 Trochanteric bursitis, right hip: Secondary | ICD-10-CM | POA: Diagnosis not present

## 2021-10-30 DIAGNOSIS — R262 Difficulty in walking, not elsewhere classified: Secondary | ICD-10-CM | POA: Diagnosis not present

## 2021-10-30 DIAGNOSIS — M7062 Trochanteric bursitis, left hip: Secondary | ICD-10-CM | POA: Diagnosis not present

## 2021-11-01 DIAGNOSIS — M25552 Pain in left hip: Secondary | ICD-10-CM | POA: Diagnosis not present

## 2021-11-01 DIAGNOSIS — M25652 Stiffness of left hip, not elsewhere classified: Secondary | ICD-10-CM | POA: Diagnosis not present

## 2021-11-01 DIAGNOSIS — R262 Difficulty in walking, not elsewhere classified: Secondary | ICD-10-CM | POA: Diagnosis not present

## 2021-11-01 DIAGNOSIS — M25651 Stiffness of right hip, not elsewhere classified: Secondary | ICD-10-CM | POA: Diagnosis not present

## 2021-11-01 DIAGNOSIS — M7061 Trochanteric bursitis, right hip: Secondary | ICD-10-CM | POA: Diagnosis not present

## 2021-11-01 DIAGNOSIS — M7062 Trochanteric bursitis, left hip: Secondary | ICD-10-CM | POA: Diagnosis not present

## 2021-11-01 DIAGNOSIS — M25551 Pain in right hip: Secondary | ICD-10-CM | POA: Diagnosis not present

## 2021-11-05 DIAGNOSIS — R262 Difficulty in walking, not elsewhere classified: Secondary | ICD-10-CM | POA: Diagnosis not present

## 2021-11-05 DIAGNOSIS — M25551 Pain in right hip: Secondary | ICD-10-CM | POA: Diagnosis not present

## 2021-11-05 DIAGNOSIS — M7062 Trochanteric bursitis, left hip: Secondary | ICD-10-CM | POA: Diagnosis not present

## 2021-11-05 DIAGNOSIS — M7061 Trochanteric bursitis, right hip: Secondary | ICD-10-CM | POA: Diagnosis not present

## 2021-11-05 DIAGNOSIS — M25552 Pain in left hip: Secondary | ICD-10-CM | POA: Diagnosis not present

## 2021-11-05 DIAGNOSIS — M25652 Stiffness of left hip, not elsewhere classified: Secondary | ICD-10-CM | POA: Diagnosis not present

## 2021-11-05 DIAGNOSIS — M25651 Stiffness of right hip, not elsewhere classified: Secondary | ICD-10-CM | POA: Diagnosis not present

## 2021-11-08 DIAGNOSIS — M25651 Stiffness of right hip, not elsewhere classified: Secondary | ICD-10-CM | POA: Diagnosis not present

## 2021-11-08 DIAGNOSIS — R262 Difficulty in walking, not elsewhere classified: Secondary | ICD-10-CM | POA: Diagnosis not present

## 2021-11-08 DIAGNOSIS — M7061 Trochanteric bursitis, right hip: Secondary | ICD-10-CM | POA: Diagnosis not present

## 2021-11-08 DIAGNOSIS — M7062 Trochanteric bursitis, left hip: Secondary | ICD-10-CM | POA: Diagnosis not present

## 2021-11-08 DIAGNOSIS — M25552 Pain in left hip: Secondary | ICD-10-CM | POA: Diagnosis not present

## 2021-11-08 DIAGNOSIS — M25551 Pain in right hip: Secondary | ICD-10-CM | POA: Diagnosis not present

## 2021-11-08 DIAGNOSIS — M25652 Stiffness of left hip, not elsewhere classified: Secondary | ICD-10-CM | POA: Diagnosis not present

## 2021-11-12 DIAGNOSIS — R262 Difficulty in walking, not elsewhere classified: Secondary | ICD-10-CM | POA: Diagnosis not present

## 2021-11-12 DIAGNOSIS — M7062 Trochanteric bursitis, left hip: Secondary | ICD-10-CM | POA: Diagnosis not present

## 2021-11-12 DIAGNOSIS — M25652 Stiffness of left hip, not elsewhere classified: Secondary | ICD-10-CM | POA: Diagnosis not present

## 2021-11-12 DIAGNOSIS — M25551 Pain in right hip: Secondary | ICD-10-CM | POA: Diagnosis not present

## 2021-11-12 DIAGNOSIS — M7061 Trochanteric bursitis, right hip: Secondary | ICD-10-CM | POA: Diagnosis not present

## 2021-11-12 DIAGNOSIS — M25651 Stiffness of right hip, not elsewhere classified: Secondary | ICD-10-CM | POA: Diagnosis not present

## 2021-11-12 DIAGNOSIS — M25552 Pain in left hip: Secondary | ICD-10-CM | POA: Diagnosis not present

## 2021-11-20 ENCOUNTER — Other Ambulatory Visit (HOSPITAL_COMMUNITY): Payer: Self-pay

## 2021-11-21 ENCOUNTER — Other Ambulatory Visit (HOSPITAL_COMMUNITY): Payer: Self-pay

## 2021-11-23 DIAGNOSIS — R262 Difficulty in walking, not elsewhere classified: Secondary | ICD-10-CM | POA: Diagnosis not present

## 2021-11-23 DIAGNOSIS — M25551 Pain in right hip: Secondary | ICD-10-CM | POA: Diagnosis not present

## 2021-11-23 DIAGNOSIS — M25651 Stiffness of right hip, not elsewhere classified: Secondary | ICD-10-CM | POA: Diagnosis not present

## 2021-11-23 DIAGNOSIS — M7061 Trochanteric bursitis, right hip: Secondary | ICD-10-CM | POA: Diagnosis not present

## 2021-11-23 DIAGNOSIS — M25652 Stiffness of left hip, not elsewhere classified: Secondary | ICD-10-CM | POA: Diagnosis not present

## 2021-11-23 DIAGNOSIS — M25552 Pain in left hip: Secondary | ICD-10-CM | POA: Diagnosis not present

## 2021-11-23 DIAGNOSIS — M7062 Trochanteric bursitis, left hip: Secondary | ICD-10-CM | POA: Diagnosis not present

## 2021-11-30 DIAGNOSIS — R262 Difficulty in walking, not elsewhere classified: Secondary | ICD-10-CM | POA: Diagnosis not present

## 2021-11-30 DIAGNOSIS — M7061 Trochanteric bursitis, right hip: Secondary | ICD-10-CM | POA: Diagnosis not present

## 2021-11-30 DIAGNOSIS — M25552 Pain in left hip: Secondary | ICD-10-CM | POA: Diagnosis not present

## 2021-11-30 DIAGNOSIS — M25651 Stiffness of right hip, not elsewhere classified: Secondary | ICD-10-CM | POA: Diagnosis not present

## 2021-11-30 DIAGNOSIS — M25652 Stiffness of left hip, not elsewhere classified: Secondary | ICD-10-CM | POA: Diagnosis not present

## 2021-11-30 DIAGNOSIS — M25551 Pain in right hip: Secondary | ICD-10-CM | POA: Diagnosis not present

## 2021-11-30 DIAGNOSIS — M7062 Trochanteric bursitis, left hip: Secondary | ICD-10-CM | POA: Diagnosis not present

## 2021-12-05 ENCOUNTER — Other Ambulatory Visit (HOSPITAL_COMMUNITY): Payer: Self-pay

## 2021-12-07 DIAGNOSIS — M25552 Pain in left hip: Secondary | ICD-10-CM | POA: Diagnosis not present

## 2021-12-07 DIAGNOSIS — M25651 Stiffness of right hip, not elsewhere classified: Secondary | ICD-10-CM | POA: Diagnosis not present

## 2021-12-07 DIAGNOSIS — M7061 Trochanteric bursitis, right hip: Secondary | ICD-10-CM | POA: Diagnosis not present

## 2021-12-07 DIAGNOSIS — R262 Difficulty in walking, not elsewhere classified: Secondary | ICD-10-CM | POA: Diagnosis not present

## 2021-12-07 DIAGNOSIS — M25551 Pain in right hip: Secondary | ICD-10-CM | POA: Diagnosis not present

## 2021-12-07 DIAGNOSIS — M7062 Trochanteric bursitis, left hip: Secondary | ICD-10-CM | POA: Diagnosis not present

## 2021-12-07 DIAGNOSIS — M25652 Stiffness of left hip, not elsewhere classified: Secondary | ICD-10-CM | POA: Diagnosis not present

## 2021-12-14 DIAGNOSIS — M25652 Stiffness of left hip, not elsewhere classified: Secondary | ICD-10-CM | POA: Diagnosis not present

## 2021-12-14 DIAGNOSIS — M7062 Trochanteric bursitis, left hip: Secondary | ICD-10-CM | POA: Diagnosis not present

## 2021-12-14 DIAGNOSIS — M25651 Stiffness of right hip, not elsewhere classified: Secondary | ICD-10-CM | POA: Diagnosis not present

## 2021-12-14 DIAGNOSIS — M7061 Trochanteric bursitis, right hip: Secondary | ICD-10-CM | POA: Diagnosis not present

## 2021-12-14 DIAGNOSIS — R262 Difficulty in walking, not elsewhere classified: Secondary | ICD-10-CM | POA: Diagnosis not present

## 2021-12-14 DIAGNOSIS — M25552 Pain in left hip: Secondary | ICD-10-CM | POA: Diagnosis not present

## 2021-12-14 DIAGNOSIS — M25551 Pain in right hip: Secondary | ICD-10-CM | POA: Diagnosis not present

## 2021-12-18 DIAGNOSIS — E559 Vitamin D deficiency, unspecified: Secondary | ICD-10-CM | POA: Diagnosis not present

## 2021-12-18 DIAGNOSIS — I10 Essential (primary) hypertension: Secondary | ICD-10-CM | POA: Diagnosis not present

## 2021-12-18 DIAGNOSIS — J309 Allergic rhinitis, unspecified: Secondary | ICD-10-CM | POA: Diagnosis not present

## 2021-12-18 DIAGNOSIS — R7989 Other specified abnormal findings of blood chemistry: Secondary | ICD-10-CM | POA: Diagnosis not present

## 2021-12-21 DIAGNOSIS — E538 Deficiency of other specified B group vitamins: Secondary | ICD-10-CM | POA: Diagnosis not present

## 2021-12-28 DIAGNOSIS — M7062 Trochanteric bursitis, left hip: Secondary | ICD-10-CM | POA: Diagnosis not present

## 2021-12-28 DIAGNOSIS — M25651 Stiffness of right hip, not elsewhere classified: Secondary | ICD-10-CM | POA: Diagnosis not present

## 2021-12-28 DIAGNOSIS — M25552 Pain in left hip: Secondary | ICD-10-CM | POA: Diagnosis not present

## 2021-12-28 DIAGNOSIS — R262 Difficulty in walking, not elsewhere classified: Secondary | ICD-10-CM | POA: Diagnosis not present

## 2021-12-28 DIAGNOSIS — M7061 Trochanteric bursitis, right hip: Secondary | ICD-10-CM | POA: Diagnosis not present

## 2021-12-28 DIAGNOSIS — M25551 Pain in right hip: Secondary | ICD-10-CM | POA: Diagnosis not present

## 2021-12-28 DIAGNOSIS — M25652 Stiffness of left hip, not elsewhere classified: Secondary | ICD-10-CM | POA: Diagnosis not present

## 2022-01-04 DIAGNOSIS — M25552 Pain in left hip: Secondary | ICD-10-CM | POA: Diagnosis not present

## 2022-01-04 DIAGNOSIS — M25551 Pain in right hip: Secondary | ICD-10-CM | POA: Diagnosis not present

## 2022-01-04 DIAGNOSIS — R262 Difficulty in walking, not elsewhere classified: Secondary | ICD-10-CM | POA: Diagnosis not present

## 2022-01-04 DIAGNOSIS — M25651 Stiffness of right hip, not elsewhere classified: Secondary | ICD-10-CM | POA: Diagnosis not present

## 2022-01-04 DIAGNOSIS — M7062 Trochanteric bursitis, left hip: Secondary | ICD-10-CM | POA: Diagnosis not present

## 2022-01-04 DIAGNOSIS — M25652 Stiffness of left hip, not elsewhere classified: Secondary | ICD-10-CM | POA: Diagnosis not present

## 2022-01-04 DIAGNOSIS — E538 Deficiency of other specified B group vitamins: Secondary | ICD-10-CM | POA: Diagnosis not present

## 2022-01-04 DIAGNOSIS — M7061 Trochanteric bursitis, right hip: Secondary | ICD-10-CM | POA: Diagnosis not present

## 2022-01-18 DIAGNOSIS — E538 Deficiency of other specified B group vitamins: Secondary | ICD-10-CM | POA: Diagnosis not present

## 2022-02-01 DIAGNOSIS — E538 Deficiency of other specified B group vitamins: Secondary | ICD-10-CM | POA: Diagnosis not present

## 2022-02-18 DIAGNOSIS — E538 Deficiency of other specified B group vitamins: Secondary | ICD-10-CM | POA: Diagnosis not present

## 2022-03-01 DIAGNOSIS — E538 Deficiency of other specified B group vitamins: Secondary | ICD-10-CM | POA: Diagnosis not present

## 2022-03-15 DIAGNOSIS — E538 Deficiency of other specified B group vitamins: Secondary | ICD-10-CM | POA: Diagnosis not present

## 2022-03-29 DIAGNOSIS — E559 Vitamin D deficiency, unspecified: Secondary | ICD-10-CM | POA: Diagnosis not present

## 2022-03-29 DIAGNOSIS — E538 Deficiency of other specified B group vitamins: Secondary | ICD-10-CM | POA: Diagnosis not present

## 2022-04-02 ENCOUNTER — Other Ambulatory Visit: Payer: Self-pay | Admitting: Obstetrics and Gynecology

## 2022-04-02 DIAGNOSIS — Z1231 Encounter for screening mammogram for malignant neoplasm of breast: Secondary | ICD-10-CM

## 2022-05-21 ENCOUNTER — Other Ambulatory Visit: Payer: Self-pay

## 2022-05-21 ENCOUNTER — Encounter (HOSPITAL_COMMUNITY): Payer: Self-pay | Admitting: Emergency Medicine

## 2022-05-21 ENCOUNTER — Encounter (HOSPITAL_COMMUNITY)
Admission: EM | Disposition: A | Discharge status: Home / Self Care | Payer: Self-pay | Source: Home / Self Care | Attending: Emergency Medicine

## 2022-05-21 ENCOUNTER — Emergency Department (EMERGENCY_DEPARTMENT_HOSPITAL): Payer: 59 | Admitting: Anesthesiology

## 2022-05-21 ENCOUNTER — Ambulatory Visit (HOSPITAL_COMMUNITY)
Admission: EM | Admit: 2022-05-21 | Discharge: 2022-05-21 | Disposition: A | Discharge status: Home / Self Care | Payer: 59 | Attending: Emergency Medicine | Admitting: Emergency Medicine

## 2022-05-21 ENCOUNTER — Emergency Department (HOSPITAL_COMMUNITY): Payer: 59 | Admitting: Anesthesiology

## 2022-05-21 DIAGNOSIS — R1310 Dysphagia, unspecified: Secondary | ICD-10-CM | POA: Diagnosis not present

## 2022-05-21 DIAGNOSIS — K2289 Other specified disease of esophagus: Secondary | ICD-10-CM | POA: Diagnosis not present

## 2022-05-21 DIAGNOSIS — Z79899 Other long term (current) drug therapy: Secondary | ICD-10-CM | POA: Insufficient documentation

## 2022-05-21 DIAGNOSIS — I10 Essential (primary) hypertension: Secondary | ICD-10-CM | POA: Insufficient documentation

## 2022-05-21 DIAGNOSIS — X58XXXA Exposure to other specified factors, initial encounter: Secondary | ICD-10-CM | POA: Insufficient documentation

## 2022-05-21 DIAGNOSIS — K219 Gastro-esophageal reflux disease without esophagitis: Secondary | ICD-10-CM | POA: Insufficient documentation

## 2022-05-21 DIAGNOSIS — K222 Esophageal obstruction: Secondary | ICD-10-CM | POA: Diagnosis not present

## 2022-05-21 DIAGNOSIS — T18128A Food in esophagus causing other injury, initial encounter: Secondary | ICD-10-CM

## 2022-05-21 DIAGNOSIS — K200 Eosinophilic esophagitis: Secondary | ICD-10-CM | POA: Diagnosis not present

## 2022-05-21 HISTORY — PX: BIOPSY: SHX5522

## 2022-05-21 HISTORY — PX: ESOPHAGOGASTRODUODENOSCOPY (EGD) WITH PROPOFOL: SHX5813

## 2022-05-21 HISTORY — PX: FOREIGN BODY REMOVAL: SHX962

## 2022-05-21 SURGERY — ESOPHAGOGASTRODUODENOSCOPY (EGD) WITH PROPOFOL
Anesthesia: Monitor Anesthesia Care

## 2022-05-21 MED ORDER — LIDOCAINE 2% (20 MG/ML) 5 ML SYRINGE
INTRAMUSCULAR | Status: DC | PRN
  Administered 2022-05-21: 60 mg via INTRAVENOUS

## 2022-05-21 MED ORDER — PROPOFOL 10 MG/ML IV BOLUS
INTRAVENOUS | Status: DC | PRN
  Administered 2022-05-21: 30 mg via INTRAVENOUS
  Administered 2022-05-21 (×2): 20 mg via INTRAVENOUS

## 2022-05-21 MED ORDER — PROPOFOL 10 MG/ML IV BOLUS
INTRAVENOUS | Status: AC
  Filled 2022-05-21: qty 20

## 2022-05-21 MED ORDER — GLUCAGON HCL RDNA (DIAGNOSTIC) 1 MG IJ SOLR
1.0000 mg | Freq: Once | INTRAMUSCULAR | Status: AC
  Administered 2022-05-21: 1 mg via INTRAVENOUS
  Filled 2022-05-21: qty 1

## 2022-05-21 MED ORDER — LACTATED RINGERS IV SOLN
INTRAVENOUS | Status: DC
  Administered 2022-05-21: 1000 mL via INTRAVENOUS

## 2022-05-21 MED ORDER — PROPOFOL 500 MG/50ML IV EMUL
INTRAVENOUS | Status: DC | PRN
  Administered 2022-05-21: 125 ug/kg/min via INTRAVENOUS

## 2022-05-21 MED ORDER — PROPOFOL 500 MG/50ML IV EMUL
INTRAVENOUS | Status: AC
  Filled 2022-05-21: qty 50

## 2022-05-21 MED ORDER — SODIUM CHLORIDE 0.9 % IV SOLN: INTRAVENOUS | Status: DC

## 2022-05-21 SURGICAL SUPPLY — 15 items

## 2022-05-21 NOTE — Anesthesia Preprocedure Evaluation (Addendum)
Anesthesia Evaluation  Patient identified by MRN, date of birth, ID band Patient awake    Reviewed: Allergy & Precautions, NPO status , Patient's Chart, lab work & pertinent test results, reviewed documented beta blocker date and time   Airway Mallampati: I  TM Distance: >3 FB Neck ROM: Full    Dental  (+) Teeth Intact, Dental Advisory Given   Pulmonary neg pulmonary ROS   breath sounds clear to auscultation       Cardiovascular  Rhythm:Regular Rate:Normal     Neuro/Psych negative neurological ROS  negative psych ROS   GI/Hepatic Neg liver ROS,GERD  Medicated,,  Endo/Other  negative endocrine ROS    Renal/GU negative Renal ROS     Musculoskeletal negative musculoskeletal ROS (+)    Abdominal   Peds  Hematology negative hematology ROS (+)   Anesthesia Other Findings   Reproductive/Obstetrics                             Anesthesia Physical Anesthesia Plan  ASA: 2  Anesthesia Plan: General   Post-op Pain Management: Minimal or no pain anticipated   Induction: Intravenous  PONV Risk Score and Plan: Ondansetron and Midazolam  Airway Management Planned: Oral ETT  Additional Equipment: None  Intra-op Plan:   Post-operative Plan: Extubation in OR  Informed Consent: I have reviewed the patients History and Physical, chart, labs and discussed the procedure including the risks, benefits and alternatives for the proposed anesthesia with the patient or authorized representative who has indicated his/her understanding and acceptance.     Dental advisory given  Plan Discussed with: CRNA  Anesthesia Plan Comments:        Anesthesia Quick Evaluation

## 2022-05-21 NOTE — Transfer of Care (Signed)
Immediate Anesthesia Transfer of Care Note  Patient: Tara Lopez  Procedure(s) Performed: ESOPHAGOGASTRODUODENOSCOPY (EGD) WITH PROPOFOL BIOPSY FOREIGN BODY REMOVAL  Patient Location: PACU and Endoscopy Unit  Anesthesia Type:MAC  Level of Consciousness: awake, drowsy, and patient cooperative  Airway & Oxygen Therapy: Patient Spontanous Breathing and Patient connected to nasal cannula oxygen  Post-op Assessment: Report given to RN and Post -op Vital signs reviewed and stable  Post vital signs: Reviewed and stable  Last Vitals:  Vitals Value Taken Time  BP 131/73 05/21/22 1243  Temp    Pulse 64 05/21/22 1243  Resp 19 05/21/22 1243  SpO2 100 % 05/21/22 1243  Vitals shown include unvalidated device data.  Last Pain:  Vitals:   05/21/22 1150  TempSrc: Temporal  PainSc: 2          Complications: No notable events documented.

## 2022-05-21 NOTE — Anesthesia Postprocedure Evaluation (Signed)
Anesthesia Post Note  Patient: Tara Lopez  Procedure(s) Performed: ESOPHAGOGASTRODUODENOSCOPY (EGD) WITH PROPOFOL BIOPSY FOREIGN BODY REMOVAL     Patient location during evaluation: PACU Anesthesia Type: MAC Level of consciousness: awake and alert Pain management: pain level controlled Vital Signs Assessment: post-procedure vital signs reviewed and stable Respiratory status: spontaneous breathing, nonlabored ventilation, respiratory function stable and patient connected to nasal cannula oxygen Cardiovascular status: stable and blood pressure returned to baseline Postop Assessment: no apparent nausea or vomiting Anesthetic complications: no  No notable events documented.  Last Vitals:  Vitals:   05/21/22 1310 05/21/22 1311  BP:  126/85  Pulse: 72   Resp:  19  Temp:    SpO2: 97%     Last Pain:  Vitals:   05/21/22 1256  TempSrc: Temporal  PainSc:                  Effie Berkshire

## 2022-05-21 NOTE — Consult Note (Addendum)
Reason for Consult:Food impaction. Referring Physician: ER MD  Tara Lopez is an 57 y.o. female.  HPI: 57 year old white female with longstanding history of reflux, who has had some intermittent problems with dysphagia and food impaction several years ago, had recurrent symptoms last night when she ate some chicken. Since then she has not been able to drink any liquids and has been regurgitating these back up.  Past Medical History:  Diagnosis Date   Seasonal allergies     GERD (gastroesophageal reflux disease) Hypertension    Right hip bursitis    Past Surgical History:  Procedure Laterality Date   CESAREAN SECTION     x3   OVARIAN CYST SURGERY     TUBAL LIGATION     Family History  Problem Relation Age of Onset   Hypertension Mother    High Cholesterol Mother    Breast cancer Neg Hx    Social History:  reports that she has never smoked. She has never used smokeless tobacco. She reports current alcohol use of about 7.0 standard drinks of alcohol per week. She reports that she does not use drugs.  Allergies:  Allergies  Allergen Reactions   Other     Bandage adhesives   Review of Systems  Constitutional: Negative.   HENT: Negative.    Eyes: Negative.   Gastrointestinal: Negative.   Endocrine: Negative.   Genitourinary: Negative.   Allergic/Immunologic: Negative.   Neurological: Negative.   Hematological: Negative.   Psychiatric/Behavioral: Negative.     Blood pressure (!) 155/89, pulse 71, temperature 98.5 F (36.9 C), temperature source Oral, resp. rate 16, height '5\' 6"'$  (1.676 m), weight 72.6 kg, SpO2 99 %. Physical Exam Constitutional:      Appearance: Normal appearance.  HENT:     Head: Normocephalic and atraumatic.     Mouth/Throat:     Mouth: Mucous membranes are moist.  Eyes:     Extraocular Movements: Extraocular movements intact.     Pupils: Pupils are equal, round, and reactive to light.  Cardiovascular:     Rate and Rhythm: Normal rate and  regular rhythm.  Pulmonary:     Effort: Pulmonary effort is normal.     Breath sounds: Normal breath sounds.  Abdominal:     General: Abdomen is flat. Bowel sounds are normal.  Musculoskeletal:     Cervical back: Normal range of motion and neck supple.  Skin:    General: Skin is warm and dry.  Neurological:     General: No focal deficit present.     Mental Status: She is alert and oriented to person, place, and time.  Psychiatric:        Mood and Affect: Mood normal.        Behavior: Behavior normal.        Thought Content: Thought content normal.        Judgment: Judgment normal.   Assessment/Plan: 1) Food impaction/Acid reflux-will proceed with an EGD now-on PPI's.Marland Kitchen 2) HTN-On Meoprolol. 3) Seasonal allergies-Flonase & Singular.Juanita Craver 05/21/2022, 9:52 AM

## 2022-05-21 NOTE — Discharge Instructions (Signed)

## 2022-05-21 NOTE — Anesthesia Procedure Notes (Signed)
Procedure Name: MAC Date/Time: 05/21/2022 12:26 PM  Performed by: Lollie Sails, CRNAPre-anesthesia Checklist: Patient identified, Emergency Drugs available, Suction available, Patient being monitored and Timeout performed Oxygen Delivery Method: Nasal cannula Placement Confirmation: positive ETCO2

## 2022-05-21 NOTE — ED Triage Notes (Signed)
Patient arrives ambulatory by POV c/o food bolus. Patient states last night while eating dinner (chicken breast) food getting caught. This morning while drinking coffee and water feeling like it was getting caught then coming back up.

## 2022-05-21 NOTE — ED Provider Notes (Signed)
[place] [facility] Provider Note   CSN: [id number] Arrival date & time: 05/21/22  [id number]     History  Chief Complaint  Patient presents with   Food Bolus    Tara Lopez is a 57 y.o. female.  Patient presents today for evaluation of esophageal food impaction.  This started after eating chicken last night.  She had the sensation of pressure in her chest like the food got stuck.  She has had regurgitation with drinking since then, most recently about 30 minutes prior to arrival when she tried to drink some coffee.  She was able to sleep and handle secretions.  Patient has had a history of GERD, for which she takes Tums as needed.  She has had similar instances in the past, which resolved spontaneously.  Last time she estimates was 5-6 years ago however.  She has not ever had a esophageal dilation.  She reports remote endoscopy.  GI is Dr. Collene Mares.         Home Medications Prior to Admission medications   Medication Sig Start Date End Date Taking? Authorizing Provider  amoxicillin-clavulanate (AUGMENTIN) 875-125 MG tablet Take 1 tablet by mouth every 12 hours. 02/20/21   Raylene Everts, MD  betamethasone dipropionate (DIPROLENE) 0.05 % cream Take as directed. 11/10/18   [provider]  buPROPion (WELLBUTRIN XL) 150 MG 24 hr tablet Take 1 tablet by mouth in the morning 05/28/21     cetirizine (ZYRTEC) 10 MG tablet Take 10 mg by mouth daily.    [provider]  Cholecalciferol (VITAMIN D) 50 MCG (2000 UT) tablet 1 tablet    [provider]  COVID-19 At Home Antigen Test [facility] COVID-19 HOME TEST) KIT Use as directed within package instructions. 12/01/20   Edmon Crape, RPH  COVID-19 mRNA vaccine, Moderna, 100 MCG/0.5ML injection Inject into the muscle. 10/20/20   Carlyle Basques, MD  desoximetasone (TOPICORT) 0.05 % cream Apply 1 application topically 2 (two) times daily.    [provider]  escitalopram  (LEXAPRO) 5 MG tablet TAKE 1 TABLET BY MOUTH ONCE A DAY Patient not taking: Reported on 02/20/2021 06/13/20 06/13/21  Marda Stalker, PA-C  escitalopram (LEXAPRO) 5 MG tablet take 1 tablet by mouth Once a day 90 days Patient not taking: Reported on 02/20/2021 07/11/20     fluticasone [facility] ALLERGY RELIEF) 50 MCG/ACT nasal spray Use 1 spray in each nostril daily 06/20/21     fluticasone (FLONASE) 50 MCG/ACT nasal spray Place 2 sprays into both nostrils daily. 06/08/16   Chevis Pretty, FNP  hydrOXYzine (ATARAX/VISTARIL) 25 MG tablet take 1 tablet by mouth at bedtime as needed Patient not taking: Reported on 02/20/2021 07/11/20     meloxicam (MOBIC) 15 MG tablet Take 1 tablet by mouth Once a day as needed for pain for 14 days 09/20/21     metoprolol succinate (TOPROL-XL) 25 MG 24 hr tablet Take 1 tablet by mouth once a day 07/05/21     metroNIDAZOLE (METROCREAM) 0.75 % cream As needed. 08/27/18   [provider]  montelukast (SINGULAIR) 10 MG tablet Take 1 tablet by mouth at bedtime. Patient not taking: Reported on 02/20/2021 12/23/18   [provider]  montelukast (SINGULAIR) 10 MG tablet Take 1 tablet by mouth once daily 06/19/21     Multiple Vitamin (MULTIVITAMIN WITH MINERALS) TABS tablet Take 1 tablet by mouth daily.    [provider]  naproxen sodium (ANAPROX DS) 550 MG tablet Take 1  tablet by mouth 2  times daily with a meal. 02/20/21   Raylene Everts, MD  Omega-3 Fatty Acids (FISH OIL) 1000 MG CAPS 1 capsule    [provider]  omeprazole (PRILOSEC) 40 MG capsule 1 capsule 30 minutes before morning meal 05/25/19   [provider]  Probiotic Product (PROBIOTIC FORMULA PO) Take 1 tablet by mouth daily.    [provider]      Allergies    Other    Review of Systems   Review of Systems  Physical Exam Updated Vital Signs BP (!) 155/89   Pulse 71   Temp 98.5 F (36.9 C) (Oral)   Resp 16   Ht '5\' 6"'$  (1.676 m)   Wt 72.6 kg   SpO2  99%   BMI 25.82 kg/m   Physical Exam Vitals and nursing note reviewed.  Constitutional:      Appearance: She is well-developed.  HENT:     Head: Normocephalic and atraumatic.     Nose: Nose normal.     Mouth/Throat:     Mouth: Mucous membranes are moist.     Comments: Oropharynx is clear Eyes:     Conjunctiva/sclera: Conjunctivae normal.  Cardiovascular:     Rate and Rhythm: Normal rate.  Pulmonary:     Effort: Pulmonary effort is normal. No respiratory distress.     Breath sounds: Normal breath sounds.  Abdominal:     Tenderness: There is no abdominal tenderness. There is no guarding or rebound.  Musculoskeletal:     Cervical back: Normal range of motion and neck supple.  Skin:    General: Skin is warm and dry.  Neurological:     Mental Status: She is alert.     ED Results / Procedures / Treatments   Labs (all labs ordered are listed, but only abnormal results are displayed) Labs Reviewed  SURGICAL PATHOLOGY    EKG None  Radiology No results found.  Procedures Procedures    Medications Ordered in ED Medications  glucagon (human recombinant) (GLUCAGEN) injection 1 mg (has no administration in time range)    ED Course/ Medical Decision Making/ A&P    Patient seen and examined. History obtained directly from patient.   Labs/EKG: None ordered  Imaging: None ordered  Medications/Fluids: Glucagon 1 mg IV  Most recent vital signs reviewed and are as follows: BP (!) 155/89   Pulse 71   Temp 98.5 F (36.9 C) (Oral)   Resp 16   Ht '5\' 6"'$  (1.676 m)   Wt 72.6 kg   SpO2 99%   BMI 25.82 kg/m   Initial impression: Possible esophageal food impaction.  Will trial glucagon and rechallenge.  If she fails, will need to contact GI.  9:07 AM PO challenge requested.   9:28 AM Patient continues to regurgitate after drinking water. I entered room and she is standing at bedside with emesis bag containing water she just drank.   Will discuss with her GI group.    9:31 AM consulted with Dr. Collene Mares by telephone who is going to help arrange for endoscopy.  10:48 AM Pt updated, stable. Awaiting endo.   1:12 PM Patient in endoscopy.                             Medical Decision Making Risk Prescription drug management.   Patient with esophageal food impaction.  Brought to endoscopy for resolution.        Final  Clinical Impression(s) / ED Diagnoses Final diagnoses:  Esophageal obstruction due to food impaction    Rx / DC Orders ED Discharge Orders     None         Carlisle Cater, PA-C 05/21/22 1314    Fransico Meadow, MD 05/22/22 1004

## 2022-05-21 NOTE — Op Note (Signed)
[facility] Patient Name: Tara Lopez Procedure Date: 05/21/2022 MRN: [id number] Attending MD: Juanita Craver , MD, [id number] Date of Birth: 08-Jun-1965 CSN: [id number] Age: 57 Admit Type: Inpatient Procedure:                EGD with removal of food bolus & esophageal                            biopsies. Indications:              Dysphagia, Gastro-esophageal reflux disease,                            Foreign body in the esophagus for 18 hours. Providers:                Juanita Craver, MD, Dulcy Fanny, Luan Moore,                            Technician, [place] Zenia Resides CRNA, CRNA Referring MD:             Marda Stalker, PA-C Medicines:                Monitored Anesthesia Care Complications:            No immediate complications. Estimated Blood Loss:     Estimated blood loss was minimal. Procedure:                Pre-Anesthesia Assessment: - Prior to the                            procedure, a history and physical was performed,                            and patient medications and allergies were                            reviewed. The patient's tolerance of previous                            anesthesia was also reviewed. The risks and                            benefits of the procedure and the sedation options                            and risks were discussed with the patient. All                            questions were answered, and informed consent was                            obtained. Prior Anticoagulants: The patient has                            taken no anticoagulant or antiplatelet agents. ASA  Grade Assessment: II - A patient with mild systemic                            disease. After reviewing the risks and benefits,                            the patient was deemed in satisfactory condition to                            undergo the procedure. After obtaining informed                            consent, the endoscope  was passed under direct                            vision. Throughout the procedure, the patient's                            blood pressure, pulse, and oxygen saturations were                            monitored continuously. The [id number]                            Olympus endoscope was introduced through the mouth,                            and advanced to the second part of duodenum. The                            EGD was performed with moderate difficulty due to                            presence of food. The patient tolerated the                            procedure well. Scope In: Scope Out: Findings:      Mucosal changes including longitudinal furrows were found in the entire       esophagus; esophageal findings were graded using the Eosinophilic       Esophagitis Endoscopic Reference Score (EoE-EREFS) as: Edema Grade 0       Normal (distinct vascular markings), Rings Grade 1 Mild (subtle       circumferential ridges seen on esophageal distension), Exudates Grade 0       None (no white lesions seen), Furrows Grade 1 Mild (vertical lines       without visible depth) and Stricture none (no stricture found). Biopsies       were taken with a cold forceps for histology; retroflexed view was       limited due to the debris in the stomach.      A meat bolus was found in the lower third of the esophagus; this was       fragmented with a snare and the fragments were gently pushed into the       stomach;  mild mucosa friability was noted above the GEJ; SCJ was       measured at 36 cm.      The examined duodenum was normal. Impression:               - Esophageal mucosal changes suspicious for                            Eosinophilic Esophagitis-biopsies done from the                            entire length of the esophagus.                           - Meat bolus impacted in the lower third of the                            esophagus, above the GEJ-fragmented with a snare                             and bolus was gently pushed into the stomach;                            removal was successful.                           - Normal examined duodenum. Moderate Sedation:      MC used. Recommendation:           - Soft diet for now; avoid rare meats and dry                            breads for now.                           - Continue present medications.                           - Await pathology results.                           - No Ibuprofen, Naproxen, or other non-steroidal                            anti-inflammatory drugs for 4 weeks after biopsy.                           - Return to my office in 2 weeks. Procedure Code(s):        --- Professional ---                           [phone], Esophagogastroduodenoscopy, flexible,                            transoral; with removal of foreign body(s)  [id number], 48, Esophagogastroduodenoscopy, flexible,                            transoral; with biopsy, single or multiple Diagnosis Code(s):        --- Professional ---                           [id number], Food in esophagus causing other injury,                            initial encounter                           R13.10, Dysphagia, unspecified                           K21.9, Gastro-esophageal reflux disease without                            esophagitis                           K22.89, Other specified disease of esophagus CPT copyright 2022 American Medical Association. All rights reserved. The codes documented in this report are preliminary and upon coder review may  be revised to meet current compliance requirements. Juanita Craver, MD Juanita Craver, MD 05/21/2022 12:53:04 PM This report has been signed electronically. Number of Addenda: 0

## 2022-05-22 LAB — SURGICAL PATHOLOGY

## 2022-05-24 ENCOUNTER — Ambulatory Visit: Payer: Self-pay

## 2022-05-24 DIAGNOSIS — J309 Allergic rhinitis, unspecified: Secondary | ICD-10-CM | POA: Diagnosis not present

## 2022-05-24 DIAGNOSIS — Z03818 Encounter for observation for suspected exposure to other biological agents ruled out: Secondary | ICD-10-CM | POA: Diagnosis not present

## 2022-05-24 DIAGNOSIS — J069 Acute upper respiratory infection, unspecified: Secondary | ICD-10-CM | POA: Diagnosis not present

## 2022-05-24 DIAGNOSIS — R051 Acute cough: Secondary | ICD-10-CM | POA: Diagnosis not present

## 2022-05-24 DIAGNOSIS — Z6826 Body mass index (BMI) 26.0-26.9, adult: Secondary | ICD-10-CM | POA: Diagnosis not present

## 2022-05-24 DIAGNOSIS — R52 Pain, unspecified: Secondary | ICD-10-CM | POA: Diagnosis not present

## 2022-05-26 ENCOUNTER — Encounter (HOSPITAL_COMMUNITY): Payer: Self-pay | Admitting: Gastroenterology

## 2022-06-03 ENCOUNTER — Other Ambulatory Visit (HOSPITAL_COMMUNITY): Payer: Self-pay

## 2022-06-04 ENCOUNTER — Other Ambulatory Visit (HOSPITAL_COMMUNITY): Payer: Self-pay

## 2022-06-04 ENCOUNTER — Other Ambulatory Visit: Payer: Self-pay

## 2022-06-04 MED ORDER — FLUTICASONE PROPIONATE 50 MCG/ACT NA SUSP
1.0000 | Freq: Every day | NASAL | 0 refills | Status: DC
  Filled 2022-06-04: qty 16, 60d supply, fill #0

## 2022-06-04 MED ORDER — MONTELUKAST SODIUM 10 MG PO TABS
10.0000 mg | ORAL_TABLET | Freq: Every day | ORAL | 0 refills | Status: DC
  Filled 2022-06-04: qty 90, 90d supply, fill #0

## 2022-06-06 DIAGNOSIS — K219 Gastro-esophageal reflux disease without esophagitis: Secondary | ICD-10-CM | POA: Diagnosis not present

## 2022-06-06 DIAGNOSIS — K449 Diaphragmatic hernia without obstruction or gangrene: Secondary | ICD-10-CM | POA: Diagnosis not present

## 2022-06-06 DIAGNOSIS — R1310 Dysphagia, unspecified: Secondary | ICD-10-CM | POA: Diagnosis not present

## 2022-06-06 DIAGNOSIS — K5900 Constipation, unspecified: Secondary | ICD-10-CM | POA: Diagnosis not present

## 2022-06-17 ENCOUNTER — Ambulatory Visit
Admission: RE | Admit: 2022-06-17 | Discharge: 2022-06-17 | Disposition: A | Discharge status: Home / Self Care | Payer: 59 | Source: Ambulatory Visit

## 2022-06-17 DIAGNOSIS — Z1231 Encounter for screening mammogram for malignant neoplasm of breast: Secondary | ICD-10-CM | POA: Diagnosis not present

## 2022-06-27 ENCOUNTER — Other Ambulatory Visit (HOSPITAL_COMMUNITY): Payer: Self-pay

## 2022-06-27 DIAGNOSIS — Z0000 Encounter for general adult medical examination without abnormal findings: Secondary | ICD-10-CM | POA: Diagnosis not present

## 2022-06-27 DIAGNOSIS — I10 Essential (primary) hypertension: Secondary | ICD-10-CM | POA: Diagnosis not present

## 2022-06-27 DIAGNOSIS — E559 Vitamin D deficiency, unspecified: Secondary | ICD-10-CM | POA: Diagnosis not present

## 2022-06-27 DIAGNOSIS — Z6826 Body mass index (BMI) 26.0-26.9, adult: Secondary | ICD-10-CM | POA: Diagnosis not present

## 2022-06-27 DIAGNOSIS — E538 Deficiency of other specified B group vitamins: Secondary | ICD-10-CM | POA: Diagnosis not present

## 2022-06-27 MED ORDER — METOPROLOL SUCCINATE ER 25 MG PO TB24
25.0000 mg | ORAL_TABLET | Freq: Every day | ORAL | 2 refills | Status: AC
  Filled 2022-06-27: qty 90, 90d supply, fill #0
  Filled 2022-10-21: qty 90, 90d supply, fill #1

## 2022-08-28 ENCOUNTER — Other Ambulatory Visit: Payer: Self-pay | Admitting: Oncology

## 2022-08-28 ENCOUNTER — Other Ambulatory Visit (HOSPITAL_COMMUNITY)
Admission: RE | Admit: 2022-08-28 | Discharge: 2022-08-28 | Disposition: A | Discharge status: Home / Self Care | Payer: 59

## 2022-08-28 DIAGNOSIS — Z006 Encounter for examination for normal comparison and control in clinical research program: Secondary | ICD-10-CM

## 2022-09-13 ENCOUNTER — Ambulatory Visit (INDEPENDENT_AMBULATORY_CARE_PROVIDER_SITE_OTHER): Payer: 59 | Admitting: Professional Counselor

## 2022-09-13 DIAGNOSIS — F4323 Adjustment disorder with mixed anxiety and depressed mood: Secondary | ICD-10-CM | POA: Diagnosis not present
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
## 2022-09-15 ENCOUNTER — Encounter: Payer: Self-pay | Admitting: Professional Counselor

## 2022-09-15 NOTE — Patient Instructions (Signed)
Follow-up within 1-2 weeks

## 2022-09-17 ENCOUNTER — Other Ambulatory Visit: Payer: Self-pay

## 2022-09-17 ENCOUNTER — Other Ambulatory Visit (HOSPITAL_COMMUNITY): Payer: Self-pay

## 2022-09-17 ENCOUNTER — Encounter: Payer: Self-pay | Admitting: Pharmacist

## 2022-09-17 DIAGNOSIS — L814 Other melanin hyperpigmentation: Secondary | ICD-10-CM | POA: Diagnosis not present

## 2022-09-17 DIAGNOSIS — B353 Tinea pedis: Secondary | ICD-10-CM | POA: Diagnosis not present

## 2022-09-17 DIAGNOSIS — D229 Melanocytic nevi, unspecified: Secondary | ICD-10-CM | POA: Diagnosis not present

## 2022-09-17 DIAGNOSIS — L821 Other seborrheic keratosis: Secondary | ICD-10-CM | POA: Diagnosis not present

## 2022-09-17 DIAGNOSIS — D1801 Hemangioma of skin and subcutaneous tissue: Secondary | ICD-10-CM | POA: Diagnosis not present

## 2022-09-17 DIAGNOSIS — L578 Other skin changes due to chronic exposure to nonionizing radiation: Secondary | ICD-10-CM | POA: Diagnosis not present

## 2022-09-17 MED ORDER — KETOCONAZOLE 2 % EX CREA
TOPICAL_CREAM | CUTANEOUS | 1 refills | Status: AC
  Filled 2022-09-17 – 2022-09-18 (×2): qty 60, 30d supply, fill #0

## 2022-09-18 ENCOUNTER — Other Ambulatory Visit (HOSPITAL_COMMUNITY): Payer: Self-pay

## 2022-09-18 ENCOUNTER — Other Ambulatory Visit: Payer: Self-pay

## 2022-09-25 LAB — HELIX MOLECULAR SCREEN: Genetic Analysis Overall Interpretation: NEGATIVE

## 2022-09-26 ENCOUNTER — Other Ambulatory Visit (HOSPITAL_COMMUNITY): Payer: Self-pay

## 2022-09-26 DIAGNOSIS — Z01419 Encounter for gynecological examination (general) (routine) without abnormal findings: Secondary | ICD-10-CM | POA: Diagnosis not present

## 2022-09-26 DIAGNOSIS — Z113 Encounter for screening for infections with a predominantly sexual mode of transmission: Secondary | ICD-10-CM | POA: Diagnosis not present

## 2022-09-26 DIAGNOSIS — Z6826 Body mass index (BMI) 26.0-26.9, adult: Secondary | ICD-10-CM | POA: Diagnosis not present

## 2022-09-26 DIAGNOSIS — Z30432 Encounter for removal of intrauterine contraceptive device: Secondary | ICD-10-CM | POA: Diagnosis not present

## 2022-09-26 MED ORDER — COMBIPATCH 0.05-0.25 MG/DAY TD PTTW
MEDICATED_PATCH | TRANSDERMAL | 4 refills | Status: AC
  Filled 2022-09-26: qty 24, 84d supply, fill #0

## 2022-09-27 ENCOUNTER — Other Ambulatory Visit: Payer: Self-pay

## 2022-09-27 ENCOUNTER — Ambulatory Visit (INDEPENDENT_AMBULATORY_CARE_PROVIDER_SITE_OTHER): Payer: 59 | Admitting: Professional Counselor

## 2022-09-27 ENCOUNTER — Other Ambulatory Visit (HOSPITAL_COMMUNITY): Payer: Self-pay

## 2022-09-27 DIAGNOSIS — F4323 Adjustment disorder with mixed anxiety and depressed mood: Secondary | ICD-10-CM

## 2022-09-27 MED ORDER — ESTRADIOL-NORETHINDRONE ACET 0.5-0.1 MG PO TABS
1.0000 | ORAL_TABLET | Freq: Every day | ORAL | 3 refills | Status: AC
  Filled 2022-09-27: qty 84, 84d supply, fill #0

## 2022-09-30 ENCOUNTER — Other Ambulatory Visit (HOSPITAL_COMMUNITY): Payer: Self-pay

## 2022-10-03 ENCOUNTER — Encounter: Payer: Self-pay | Admitting: Professional Counselor

## 2022-10-03 NOTE — Progress Notes (Addendum)
      [facility] Counselor/Therapist Progress Note  Patient ID: Tara Lopez, MRN: [id number],    Date: 7.12.24  Time Spent: 2:04p - 3:04p   Telehealth Visit via Video Provider location: [facility] office  Pt location: Home office Pt and counselor present for session  Treatment Type: Individual Therapy  Reported Symptoms: loneliness, low mood, sense of overwhelm, restlessness, stress  Mental Status Exam:  Appearance:   Neat     Behavior:  Appropriate and Sharing  Motor:  Normal  Speech/Language:   Clear and Coherent and Normal Rate  Affect:  Appropriate and Congruent  Mood:  normal  Thought process:  normal  Thought content:    WNL  Sensory/Perceptual disturbances:    WNL  Orientation:  oriented to person, place, time/date, and situation  Attention:  Good  Concentration:  Good  Memory:  WNL  Fund of knowledge:   Good  Insight:    Good  Judgment:   Good  Impulse Control:  Good   Risk Assessment: Danger to Self:  No Self-injurious Behavior: No Danger to Others: No Duty to Warn:no Physical Aggression / Violence:No  Access to Firearms a concern: No  Gang Involvement:No   Subjective: Counselor provided person-centered counseling including active listening, facilitation of insight, affirmation, resouricng. Pt reported to be post-menopausal per lab work and she and counselor discussed grief and loss around phase of life changes. Pt processed experience of children out of house for several weeks and her resulting sense of loneliness. Counselor affirmed pt feelings. Pt voiced both excitement and stress round eldest's son's entrance to college and her responsibility in helping him with the move and transition. Counselor and pt discussed her concern and likewise her assurances around the new circumstance for him. Pt processed dating hx and considerations for moving forward; she voiced sense that given her level of responsibility as a parent and other work/life stress that her  time and energy feels limited. Pt and counselor discussed pt support system and her reticence to lean too heavily on others, and her self protections around vulnerability.  Interventions: Solution-Oriented/Positive Psychology, Humanistic/Existential, and Insight-Oriented  Diagnosis:   ICD-10-CM   1. Adjustment disorder with mixed anxiety and depressed mood  F43.23       Plan: Pt to return in two weeks. Continue to build rapport, review and obtain consent for treatment plan, cotnue process work Pensions consultant.   Gaspar Bidding, [facility]

## 2022-10-07 ENCOUNTER — Other Ambulatory Visit (HOSPITAL_COMMUNITY): Payer: Self-pay

## 2022-10-07 DIAGNOSIS — Z79890 Hormone replacement therapy (postmenopausal): Secondary | ICD-10-CM | POA: Diagnosis not present

## 2022-10-07 MED ORDER — FLUTICASONE PROPIONATE 50 MCG/ACT NA SUSP
NASAL | 0 refills | Status: AC
  Filled 2022-10-07 (×2): qty 48, 90d supply, fill #0

## 2022-10-07 MED ORDER — PROGESTERONE MICRONIZED 100 MG PO CAPS
200.0000 mg | ORAL_CAPSULE | Freq: Every day | ORAL | 3 refills | Status: DC
  Filled 2022-10-07: qty 180, 90d supply, fill #0

## 2022-10-07 MED ORDER — ESTRADIOL 0.05 MG/24HR TD PTTW
1.0000 | MEDICATED_PATCH | TRANSDERMAL | 3 refills | Status: AC
  Filled 2022-10-07: qty 24, 84d supply, fill #0
  Filled 2022-12-25: qty 24, 84d supply, fill #1
  Filled 2023-03-19: qty 24, 84d supply, fill #2
  Filled 2023-03-21: qty 8, 28d supply, fill #2

## 2022-10-11 ENCOUNTER — Encounter: Payer: Self-pay | Admitting: Professional Counselor

## 2022-10-11 ENCOUNTER — Ambulatory Visit (INDEPENDENT_AMBULATORY_CARE_PROVIDER_SITE_OTHER): Payer: 59 | Admitting: Professional Counselor

## 2022-10-11 DIAGNOSIS — F4323 Adjustment disorder with mixed anxiety and depressed mood: Secondary | ICD-10-CM

## 2022-10-11 NOTE — Progress Notes (Unsigned)
      [facility] Counselor/Therapist Progress Note  Patient ID: SATORIA TOKUDA, MRN: [id number],    Date: 10/11/2022  Time Spent: 2:06p - 3:14p   Treatment Type: Individual Therapy  Reported Symptoms: worries, stress, self esteem concerns, interpersonal concerns  Mental Status Exam:  Appearance:   Neat     Behavior:  Appropriate, Sharing, and Motivated  Motor:  Normal  Speech/Language:   Clear and Coherent and Normal Rate  Affect:  Appropriate and Congruent  Mood:  normal  Thought process:  normal  Thought content:    WNL  Sensory/Perceptual disturbances:    WNL  Orientation:  oriented to person, place, time/date, and situation  Attention:  Good  Concentration:  Good  Memory:  WNL  Fund of knowledge:   Good  Insight:    Good  Judgment:   Good  Impulse Control:  Good   Risk Assessment: Danger to Self:  No Self-injurious Behavior: No Danger to Others: No Duty to Warn:no Physical Aggression / Violence:No  Access to Firearms a concern: No  Gang Involvement:No   Subjective: Pt presented to session with work-related concern regarding interpersonal conflict and dilemma. Counselor provided conflict resolution and nonviolent communication psychoeducation, and worked with pt to facilitate insight into problem-solving, and to resource pt inner wisdom and confidence around situation and leadership style and skills. Pt and counselor discussed pt activation regarding circumstance and dynamic and ways it mirrored parenting experiences; counselor affirmed and actively listened. Counselor and pt discussed pt treatment plan and pt gave consent.    Interventions: Assertiveness/Communication, Humanistic/Existential, Insight-Oriented, and Interpersonal  Diagnosis:   ICD-10-CM   1. Adjustment disorder with mixed anxiety and depressed mood  F43.23       Plan: Pt is scheduled for a follow-up. Continue process work and Conservation officer, historic buildings. Pt to practice boundaries and conflict  resolution strategies discussed in session.  Gaspar Bidding, [facility]

## 2022-10-16 NOTE — Addendum Note (Signed)
Addended by: Bartolo Darter T on: 10/16/2022 05:37 PM   Modules accepted: Level of Service

## 2022-10-21 ENCOUNTER — Other Ambulatory Visit: Payer: Self-pay

## 2022-10-21 ENCOUNTER — Other Ambulatory Visit (HOSPITAL_COMMUNITY): Payer: Self-pay

## 2022-10-21 MED ORDER — MONTELUKAST SODIUM 10 MG PO TABS
10.0000 mg | ORAL_TABLET | Freq: Every day | ORAL | 0 refills | Status: AC
  Filled 2022-10-21 (×2): qty 90, 90d supply, fill #0

## 2022-10-22 ENCOUNTER — Other Ambulatory Visit (HOSPITAL_COMMUNITY): Payer: Self-pay

## 2022-10-25 ENCOUNTER — Encounter: Payer: Self-pay | Admitting: Professional Counselor

## 2022-10-25 ENCOUNTER — Ambulatory Visit (INDEPENDENT_AMBULATORY_CARE_PROVIDER_SITE_OTHER): Payer: 59 | Admitting: Professional Counselor

## 2022-10-25 DIAGNOSIS — F4323 Adjustment disorder with mixed anxiety and depressed mood: Secondary | ICD-10-CM | POA: Diagnosis not present

## 2022-10-25 NOTE — Progress Notes (Unsigned)
      [facility] Counselor/Therapist Progress Note  Patient ID: Tara Lopez, MRN: [id number],    Date: 10/25/2022  Time Spent: 2:04p - 3:03p   Treatment Type: Individual Therapy  Modality: Telehealth via video Provider location: [facility] Patient location: Home residence Consent Received: Yes  Reported Symptoms: worries, stress, sadness, fatigue, phase of life concerns, interpersonal concerns, parenting concerns    Mental Status Exam:  Appearance:   Neat     Behavior:  Appropriate and Sharing  Motor:  Normal  Speech/Language:   Clear and Coherent and Normal Rate  Affect:  Appropriate and Congruent  Mood:  Tearful  Thought process:  normal  Thought content:    WNL  Sensory/Perceptual disturbances:    WNL  Orientation:  oriented to person, place, time/date, and situation  Attention:  Good  Concentration:  Good  Memory:  WNL  Fund of knowledge:   Good  Insight:    Good  Judgment:   Good  Impulse Control:  Good   Risk Assessment: Danger to Self:  No Self-injurious Behavior: No Danger to Others: No Duty to Warn:no Physical Aggression / Violence:No  Access to Firearms a concern: No  Gang Involvement:No   Subjective: Pt presented to session voicing excitement as well as stress and some sadness regarding taking son to college following week. She voiced happiness for him and as well the attendant stressors and managerial facets at play in the transition. Pt processed experience of younger son facing developmental phase changes and withdrawing in ways; counselor and pt discussed individuation process, and counselor assisted in resourcing strategies to enhance connection and communication. They discussed ways to approach challenging factor that necessitated communication with ex-husband, and to decrease overwork regarding others' responsibility, so that she might increase quality time and connection with son and other loved ones.   Interventions:  Assertiveness/Communication, Solution-Oriented/Positive Psychology, Humanistic/Existential, and Insight-Oriented  Diagnosis:   ICD-10-CM   1. Adjustment disorder with mixed anxiety and depressed mood  F43.23       Plan: Pt is scheduled for a follow-up. Continue process work and Conservation officer, historic buildings.   Gaspar Bidding, [facility]

## 2022-11-15 ENCOUNTER — Encounter: Payer: Self-pay | Admitting: Professional Counselor

## 2022-11-15 ENCOUNTER — Ambulatory Visit (INDEPENDENT_AMBULATORY_CARE_PROVIDER_SITE_OTHER): Payer: 59 | Admitting: Professional Counselor

## 2022-11-15 DIAGNOSIS — F4323 Adjustment disorder with mixed anxiety and depressed mood: Secondary | ICD-10-CM | POA: Diagnosis not present

## 2022-11-15 NOTE — Progress Notes (Signed)
      [facility] Counselor/Therapist Progress Note  Patient ID: Tara Lopez, MRN: [id number],    Date: 11/15/2022  Time Spent: 4:05p - 5:10p   Treatment Type: Individual Therapy  Reported Symptoms: worries, sadness, loneliness, stress, interpersonal concerns   Mental Status Exam:  Appearance:   Neat     Behavior:  Appropriate and Sharing  Motor:  Normal  Speech/Language:   Clear and Coherent and Normal Rate  Affect:  Appropriate and Congruent  Mood:  normal  Thought process:  normal  Thought content:    WNL  Sensory/Perceptual disturbances:    WNL  Orientation:  oriented to person, place, time/date, and situation  Attention:  Good  Concentration:  Good  Memory:  WNL  Fund of knowledge:   Good  Insight:    Good  Judgment:   Good  Impulse Control:  Good   Risk Assessment: Danger to Self:  No Self-injurious Behavior: No Danger to Others: No Duty to Warn:no Physical Aggression / Violence:No  Access to Firearms a concern: No  Gang Involvement:No   Subjective: Pt presented to session processing experience of having taken eldest to college and resulting emotional circumstances. Counselor and pt discussed phase of life and related factors to dynamics. Pt processed hx of marital relationship and the facets of difficulties and hurtfulness therein, and subsequent impact on her single parenting life, and overall emotional and relational well-being. Counselor actively listened, affirmed pt, helped facilitate insight, and helped pt to resource internal strengths and external support system.   Interventions: Solution-Oriented/Positive Psychology, Humanistic/Existential, and Insight-Oriented  Diagnosis:   ICD-10-CM   1. Adjustment disorder with mixed anxiety and depressed mood  F43.23       Plan: Pt is scheduled for a follow-up; continue process work and Engineer, drilling.   Gaspar Bidding, [facility]

## 2022-11-29 ENCOUNTER — Encounter: Payer: Self-pay | Admitting: Professional Counselor

## 2022-11-29 ENCOUNTER — Ambulatory Visit (INDEPENDENT_AMBULATORY_CARE_PROVIDER_SITE_OTHER): Payer: 59 | Admitting: Professional Counselor

## 2022-11-29 DIAGNOSIS — F4323 Adjustment disorder with mixed anxiety and depressed mood: Secondary | ICD-10-CM
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
## 2022-12-13 ENCOUNTER — Encounter: Payer: Self-pay | Admitting: Professional Counselor

## 2022-12-13 ENCOUNTER — Ambulatory Visit (INDEPENDENT_AMBULATORY_CARE_PROVIDER_SITE_OTHER): Payer: 59 | Admitting: Professional Counselor

## 2022-12-13 DIAGNOSIS — F4323 Adjustment disorder with mixed anxiety and depressed mood: Secondary | ICD-10-CM | POA: Diagnosis not present

## 2022-12-13 NOTE — Progress Notes (Unsigned)
      [facility] Counselor/Therapist Progress Note  Patient ID: Tara Lopez, MRN: [id number],    Date: 12/13/2022  Time Spent: 2:10p - 2:44p   Treatment Type: Individual Therapy  Telehealth via Video Provider location: [facility] office in [place], [place] Patient Location: Home office in [place], [place]  Reported Symptoms: worries, stress, restlessness, overwhelm, interpersonal concerns   Mental Status Exam:  Appearance:   Neat     Behavior:  Appropriate, Sharing, and Motivated  Motor:  Normal  Speech/Language:   Clear and Coherent and Normal Rate  Affect:  Appropriate and Congruent  Mood:  Worried  Thought process:  normal  Thought content:    WNL  Sensory/Perceptual disturbances:    WNL  Orientation:  oriented to person, place, time/date, and situation  Attention:  Good  Concentration:  Good  Memory:  WNL  Fund of knowledge:   Good  Insight:    Good  Judgment:   Good  Impulse Control:  Good   Risk Assessment: Danger to Self:  No Self-injurious Behavior: No Danger to Others: No Duty to Warn:no Physical Aggression / Violence:No  Access to Firearms a concern: No  Gang Involvement:No   Subjective: Pt reported concerns for family member at time of session; counselor helped with safety assessment, and patient ended session early to address concerns. Pt reported continuing to be concerned for her mother's health, and processed developments. Pt processed uncomfortable occurrence and steps she took to set boundaries and limit further exposure to unwanted interactions. Counselor held space, assisted with processing, affirmed pt.  Interventions: Solution-Oriented/Positive Psychology, Humanistic/Existential, and Insight-Oriented  Diagnosis:   ICD-10-CM   1. Adjustment disorder with mixed anxiety and depressed mood  F43.23       Plan: Pt is scheduled for a follow-up; continue process work and developing coping skills.  Gaspar Bidding,  [facility]

## 2022-12-25 ENCOUNTER — Other Ambulatory Visit: Payer: Self-pay

## 2022-12-27 ENCOUNTER — Other Ambulatory Visit (HOSPITAL_COMMUNITY): Payer: Self-pay

## 2022-12-30 DIAGNOSIS — Z13820 Encounter for screening for osteoporosis: Secondary | ICD-10-CM | POA: Diagnosis not present

## 2023-01-02 ENCOUNTER — Other Ambulatory Visit: Payer: Self-pay

## 2023-01-02 ENCOUNTER — Other Ambulatory Visit (HOSPITAL_COMMUNITY): Payer: Self-pay

## 2023-01-02 DIAGNOSIS — M810 Age-related osteoporosis without current pathological fracture: Secondary | ICD-10-CM | POA: Diagnosis not present

## 2023-01-02 DIAGNOSIS — N951 Menopausal and female climacteric states: Secondary | ICD-10-CM | POA: Diagnosis not present

## 2023-01-02 DIAGNOSIS — Z79890 Hormone replacement therapy (postmenopausal): Secondary | ICD-10-CM | POA: Diagnosis not present

## 2023-01-02 MED ORDER — ALENDRONATE SODIUM 70 MG PO TABS
70.0000 mg | ORAL_TABLET | ORAL | 4 refills | Status: AC
  Filled 2023-01-02 – 2023-01-03 (×2): qty 12, 84d supply, fill #0
  Filled 2023-03-10 – 2023-03-13 (×2): qty 12, 84d supply, fill #1

## 2023-01-02 MED ORDER — PROGESTERONE 200 MG PO CAPS
200.0000 mg | ORAL_CAPSULE | Freq: Every day | ORAL | 4 refills | Status: AC
  Filled 2023-01-02 – 2023-02-20 (×2): qty 90, 90d supply, fill #0

## 2023-01-03 ENCOUNTER — Other Ambulatory Visit (HOSPITAL_COMMUNITY): Payer: Self-pay

## 2023-01-03 ENCOUNTER — Other Ambulatory Visit: Payer: Self-pay

## 2023-01-10 ENCOUNTER — Encounter: Payer: Self-pay | Admitting: Professional Counselor

## 2023-01-10 ENCOUNTER — Ambulatory Visit: Payer: 59 | Admitting: Professional Counselor

## 2023-01-10 DIAGNOSIS — F4323 Adjustment disorder with mixed anxiety and depressed mood: Secondary | ICD-10-CM

## 2023-01-10 NOTE — Progress Notes (Signed)
      [facility] Counselor/Therapist Progress Note  Patient ID: Tara Lopez, MRN: [id number],    Date: 01/10/2023  Time Spent: 2:08p - 3:08p   Treatment Type: Individual Therapy  I connected with this patient by an approved telecommunication method (video), with her informed consent, and verifying identity and patient privacy.  I was located at my office and patient at her home.  As needed, we discussed the limitations, risks, and security and privacy concerns associated with telehealth service, including the availability and conditions which currently govern in-person appointments and the possibility that 3rd-party payment may not be fully guaranteed and she may be responsible for charges.  After she indicated understanding, we proceeded with the session.  Also discussed treatment planning, as needed, including ongoing verbal agreement with the plan, the opportunity to ask and answer all questions, her demonstrated understanding of instructions, and her readiness to call the office should symptoms worsen or she feels she is in a crisis state and needs more immediate and tangible assistance.   Reported Symptoms: tearfulness, sadness, worries, stress, frustration, interpersonal concerns   Mental Status Exam:  Appearance:   Neat     Behavior:  Appropriate and Sharing  Motor:  Normal  Speech/Language:   Clear and Coherent and Normal Rate  Affect:  Tearful  Mood:  sad  Thought process:  normal  Thought content:    WNL  Sensory/Perceptual disturbances:    WNL  Orientation:  Sound  Attention:  Good  Concentration:  Good  Memory:  WNL  Fund of knowledge:   Good  Insight:    Good  Judgment:   Good  Impulse Control:  Good   Risk Assessment: Danger to Self:  No Self-injurious Behavior: No Danger to Others: No Duty to Warn:no Physical Aggression / Violence:No  Access to Firearms a concern: No  Gang Involvement:No   Subjective: Pt presented to session voicing limited progress since  last session, processing challenging exchange with ex and experience of related strain with children as a result; she processed complex impact of dynamics and frustration and sadness. Counselor affirmed pt and actively listened. Pt voiced intellectual understanding of her rightfulness however pervasive sense that she is doing wrong regardless; she identified general sense of lack of control for interpersonal outcomes in spite of best efforts. Pt voiced desire to change how she reacts to these stressors both behaviorally and in terms of internal impact. She and counselor discussed strategies and protective buffers she can resource. Pt also processed interaction with friend and grieved specific losses via change between them; counselor helped facilitate insight and nurture acceptance.  Interventions: Solution-Oriented/Positive Psychology, Humanistic/Existential, and Insight-Oriented  Diagnosis:   ICD-10-CM   1. Adjustment disorder with mixed anxiety and depressed mood  F43.23       Plan: Pt is scheduled for a follow-up; continue process work and developing coping skills.   Gaspar Bidding, [facility]

## 2023-01-16 DIAGNOSIS — H52223 Regular astigmatism, bilateral: Secondary | ICD-10-CM | POA: Diagnosis not present

## 2023-01-24 ENCOUNTER — Encounter: Payer: Self-pay | Admitting: Professional Counselor

## 2023-01-24 ENCOUNTER — Ambulatory Visit: Payer: 59 | Admitting: Professional Counselor

## 2023-01-24 DIAGNOSIS — F4323 Adjustment disorder with mixed anxiety and depressed mood: Secondary | ICD-10-CM

## 2023-01-24 NOTE — Progress Notes (Signed)
      [facility] Counselor/Therapist Progress Note  Patient ID: Tara Lopez, MRN: [id number],    Date: 01/24/2023  Time Spent: 2:10p - 3:09p   Treatment Type: Individual Therapy  I connected with this patient by an approved telecommunication method (video), with her informed consent, and verifying identity and patient privacy.  I was located at my office and patient at her home.  As needed, we discussed the limitations, risks, and security and privacy concerns associated with telehealth service, including the availability and conditions which currently govern in-person appointments and the possibility that 3rd-party payment may not be fully guaranteed and she may be responsible for charges.  After she indicated understanding, we proceeded with the session.   Reported Symptoms: stress, sadness, worries, interpersonal concerns   Mental Status Exam:  Appearance:   Neat     Behavior:  Appropriate and Sharing  Motor:  Normal  Speech/Language:   Clear and Coherent and Normal Rate  Affect:  Appropriate and Congruent  Mood:  normal  Thought process:  normal  Thought content:    WNL  Sensory/Perceptual disturbances:    WNL  Orientation:  Sound  Attention:  Good  Concentration:  Good  Memory:  WNL  Fund of knowledge:   Good  Insight:    Good  Judgment:   Good  Impulse Control:  Good   Risk Assessment: Danger to Self:  No Self-injurious Behavior: No Danger to Others: No Duty to Warn:no Physical Aggression / Violence:No  Access to Firearms a concern: No  Gang Involvement:No   Subjective: Pt presented to session to address concerns of anxiety and depression. She reported mixed progress. Pt identified political stress per recent election results, and identified self care measures including pulling back from news and media coverage. She voiced ongoing interpersonal stress per ex-husband communication and co-parenting concerns. Pt processed recent experiences and experience of marriage  by hx that was challenging and impactful for pt and family system. Counselor affirmed pt feelings and experience, actively listened, and helped to facilitate insight into pt healing and growth. Pt and counselor discussed "filling [pt] cup" and what that could look like, with pt sense of responsibility across spheres of life. Pt strengths of wisdom, conscientiousness, caring nature and maturity explored and discussed.  Interventions: Solution-Oriented/Positive Psychology, Humanistic/Existential, and Insight-Oriented  Diagnosis:   ICD-10-CM   1. Adjustment disorder with mixed anxiety and depressed mood  F43.23       Plan: Pt is scheduled for a follow-up; continue process work and developing coping skills.   Gaspar Bidding, [facility]

## 2023-02-07 ENCOUNTER — Encounter: Payer: Self-pay | Admitting: Professional Counselor

## 2023-02-07 ENCOUNTER — Ambulatory Visit: Payer: 59 | Admitting: Professional Counselor

## 2023-02-07 DIAGNOSIS — F4323 Adjustment disorder with mixed anxiety and depressed mood: Secondary | ICD-10-CM | POA: Diagnosis not present

## 2023-02-07 NOTE — Progress Notes (Signed)
      [facility] Counselor/Therapist Progress Note  Patient ID: VEYONCE MCCOUN, MRN: [id number],    Date: 02/07/2023  Time Spent: 2:14 PM to 3:13 PM  Treatment Type: Individual Therapy  I connected with this patient by an approved telecommunication method (video), with her informed consent, and verifying identity and patient privacy.  I was located at my office and patient at her home.  As needed, we discussed the limitations, risks, and security and privacy concerns associated with telehealth service, including the availability and conditions which currently govern in-person appointments and the possibility that 3rd-party payment may not be fully guaranteed and she may be responsible for charges.  After she indicated understanding, we proceeded with the session.  Also discussed treatment planning, as needed, including ongoing verbal agreement with the plan, the opportunity to ask and answer all questions, her demonstrated understanding of instructions, and her readiness to call the office should symptoms worsen or she feels she is in a crisis state and needs more immediate and tangible assistance.   Reported Symptoms: Fatigue, sadness, grief/loss, phase of life concerns, interpersonal concerns, stress, tearfulness  Mental Status Exam:  Appearance:   Neat     Behavior:  Appropriate and Sharing  Motor:  Normal  Speech/Language:   Clear and Coherent and Normal Rate  Affect:  Appropriate and Congruent  Mood:  Tired , tearful  Thought process:  normal  Thought content:    WNL  Sensory/Perceptual disturbances:    WNL  Orientation:  Sound  Attention:  Good  Concentration:  Good  Memory:  WNL  Fund of knowledge:   Good  Insight:    Good  Judgment:   Good  Impulse Control:  Good   Risk Assessment: Danger to Self:  No Self-injurious Behavior: No Danger to Others: No Duty to Warn:no Physical Aggression / Violence:No  Access to Firearms a concern: No  Gang Involvement:No    Subjective: Patient presented to session to address concerns of anxiety and depression.  She reported limited progress at this time.  She processed experience of ongoing familial stress, with frustrating interactions with ex-husband and assorted conflicts within parenting concerns.  Patient voiced sadness around upcoming time with mother to go through her clothes, and processed experience of change in roles and responsibilities.  Patient and counselor discussed patient overwhelming responsibility in life, including dog with health concern at this time.  Counselor facilitated a self-care inventory with patient, and patient identified areas for improvement to be: Advocating for herself more at work, prioritizing fun, delegating and empowering others, being mindful of breath work, and taking time for healthy eating and solitude.  Interventions: Solution-Oriented/Positive Psychology, Humanistic/Existential, and Insight-Oriented  Diagnosis:   ICD-10-CM   1. Adjustment disorder with mixed anxiety and depressed mood  F43.23       Plan: Patient is scheduled for follow-up; continue process work and developing coping skills.  Patient to work on identified short-term goals per self-care inventory per list above.  Progress note was dictated via Dragon and checked for accuracy.  Gaspar Bidding, [facility]

## 2023-02-21 ENCOUNTER — Other Ambulatory Visit (HOSPITAL_COMMUNITY): Payer: Self-pay

## 2023-02-21 ENCOUNTER — Encounter: Payer: Self-pay | Admitting: Professional Counselor

## 2023-02-21 ENCOUNTER — Ambulatory Visit (INDEPENDENT_AMBULATORY_CARE_PROVIDER_SITE_OTHER): Payer: 59 | Admitting: Professional Counselor

## 2023-02-21 DIAGNOSIS — F4323 Adjustment disorder with mixed anxiety and depressed mood: Secondary | ICD-10-CM | POA: Diagnosis not present

## 2023-02-21 NOTE — Progress Notes (Addendum)
      [facility] Counselor/Therapist Progress Note  Patient ID: Tara Lopez, MRN: [id number],    Date: 12.06.2024  Time Spent: 1:08 PM to 2:10 PM  Treatment Type: Individual Therapy  I connected with this patient by an approved telecommunication method (video), with her informed consent, and verifying identity and patient privacy.  I was located at my office and patient at her home.  As needed, we discussed the limitations, risks, and security and privacy concerns associated with telehealth service, including the availability and conditions which currently govern in-person appointments and the possibility that 3rd-party payment may not be fully guaranteed and she may be responsible for charges.  After she indicated understanding, we proceeded with the session.  Also discussed treatment planning, as needed, including ongoing verbal agreement with the plan, the opportunity to ask and answer all questions, her demonstrated understanding of instructions, and her readiness to call the office should symptoms worsen or she feels she is in a crisis state and needs more immediate and tangible assistance.   Reported Symptoms: sadness, worries, interpersonal concerns, stress  Mental Status Exam:  Appearance:   Casual     Behavior:  Appropriate and Sharing  Motor:  Normal  Speech/Language:   Clear and Coherent and Normal Rate  Affect:  Appropriate and Congruent  Mood:  normal  Thought process:  normal  Thought content:    WNL  Sensory/Perceptual disturbances:    WNL  Orientation:  oriented to person, place, and situation  Attention:  Good  Concentration:  Good  Memory:  WNL  Fund of knowledge:   Good  Insight:    Good  Judgment:   Good  Impulse Control:  Good   Risk Assessment: Danger to Self:  No Self-injurious Behavior: No Danger to Others: No Duty to Warn:no Physical Aggression / Violence:No  Access to Firearms a concern: No  Gang Involvement:No   Subjective: Patient presented  to session to address concerns of anxiety and depression.  She reported mixed progress at this time.  Patient processed experience of the holiday, and long conversation with oldest son which was productive.  Patient processed experience of workplace conflict of concern, and counselor assisted with process of discernment around conflict resolution strategies.  Patient and counselor discussed patient boundaries, and patient identified short-term goal of keeping feelings in the matter as separate from her approach to situation, and other skills practice.  Counselor assisted patient in facilitating insight around her conscientiousness and patient resourcing of other strengths.  Interventions: Solution-Oriented/Positive Psychology, Humanistic/Existential, Psycho-education/Bibliotherapy, Insight-Oriented, and Conflict Resolution  Diagnosis:   ICD-10-CM   1. Adjustment disorder with mixed anxiety and depressed mood  F43.23       Plan: Patient is scheduled for follow-up; continue process work and developing coping skills.  Patient to practice short-term goal of conflict resolution strategies between sessions.  Progress note was dictated via Dragon and reviewed for accuracy.  Gaspar Bidding, [facility]

## 2023-03-06 ENCOUNTER — Ambulatory Visit: Payer: 59 | Admitting: Professional Counselor

## 2023-03-06 ENCOUNTER — Encounter: Payer: Self-pay | Admitting: Professional Counselor

## 2023-03-06 DIAGNOSIS — F4323 Adjustment disorder with mixed anxiety and depressed mood: Secondary | ICD-10-CM | POA: Diagnosis not present

## 2023-03-06 NOTE — Progress Notes (Signed)
      [facility] Counselor/Therapist Progress Note  Patient ID: Tara Lopez, MRN: [id number],    Date: 03/18/2023  Time Spent: 2:05 PM to 3:02 PM  Treatment Type: Individual Therapy  I connected with this patient by an approved telecommunication method (video), with her informed consent, and verifying identity and patient privacy.  I was located at my office and patient at her home.  As needed, we discussed the limitations, risks, and security and privacy concerns associated with telehealth service, including the availability and conditions which currently govern in-person appointments and the possibility that 3rd-party payment may not be fully guaranteed and she may be responsible for charges.  After she indicated understanding, we proceeded with the session.  Also discussed treatment planning, as needed, including ongoing verbal agreement with the plan, the opportunity to ask and answer all questions, her demonstrated understanding of instructions, and her readiness to call the office should symptoms worsen or she feels she is in a crisis state and needs more immediate and tangible assistance.   Reported Symptoms: Worries, restlessness, frustration, interpersonal concerns, grief/loss  Mental Status Exam:  Appearance:   Casual     Behavior:  Appropriate and Sharing  Motor:  Normal  Speech/Language:   Clear and Coherent and Normal Rate  Affect:  Appropriate and Congruent  Mood:  normal  Thought process:  normal  Thought content:    WNL  Sensory/Perceptual disturbances:    WNL  Orientation:  oriented to person, place, time/date, and situation  Attention:  Good  Concentration:  Good  Memory:  WNL  Fund of knowledge:   Good  Insight:    Good  Judgment:   Good  Impulse Control:  Good   Risk Assessment: Danger to Self:  No Self-injurious Behavior: No Danger to Others: No Duty to Warn:no Physical Aggression / Violence:No  Access to Firearms a concern: No  Gang Involvement:No    Subjective: Patient presented to session to address concerns of anxiety and depression.  She reported mostly progress at this time.  She reported having her son come home from college and enjoying time with him a great deal, and for dynamics to be positive.  She continued to process experience of difficulty with colleague, and counselor continue to assist with strategies to improve the situation, including conflict resolution and assertiveness skills training.  Patient also processed experience of her mother's declining health, and her sense of grief and loss around her condition.  Patient voiced having listened to resource counselor recommended last session regarding practice of dignity and disagreement, which patient voiced as helpful.  Counselor actively listened, affirmed patient feelings and experience, helped facilitate insight and resourcing.  Patient identified short-term goal of prioritizing rest, relaxation, self-care, and joyful holidays.  Interventions: Solution-Oriented/Positive Psychology, Humanistic/Existential, and Insight-Oriented  Diagnosis:   ICD-10-CM   1. Adjustment disorder with mixed anxiety and depressed mood  F43.23       Plan: Patient is scheduled for a follow-up; continue process work and developing coping skills.  Patient to resource relaxation, self-care, and pleasurable activities as short-term goal between sessions.  Progress note was dictated with Dragon and reviewed for accuracy.  Gaspar Bidding, [facility]

## 2023-03-10 ENCOUNTER — Other Ambulatory Visit (HOSPITAL_COMMUNITY): Payer: Self-pay

## 2023-03-17 ENCOUNTER — Other Ambulatory Visit (HOSPITAL_BASED_OUTPATIENT_CLINIC_OR_DEPARTMENT_OTHER): Payer: Self-pay

## 2023-03-17 DIAGNOSIS — Z6827 Body mass index (BMI) 27.0-27.9, adult: Secondary | ICD-10-CM | POA: Diagnosis not present

## 2023-03-17 DIAGNOSIS — M25511 Pain in right shoulder: Secondary | ICD-10-CM | POA: Diagnosis not present

## 2023-03-17 MED ORDER — DICLOFENAC SODIUM 50 MG PO TBEC
50.0000 mg | DELAYED_RELEASE_TABLET | Freq: Two times a day (BID) | ORAL | 1 refills | Status: AC | PRN
  Filled 2023-03-17: qty 60, 30d supply, fill #0

## 2023-03-18 ENCOUNTER — Other Ambulatory Visit: Payer: Self-pay

## 2023-03-19 ENCOUNTER — Other Ambulatory Visit (HOSPITAL_COMMUNITY): Payer: Self-pay

## 2023-03-20 ENCOUNTER — Other Ambulatory Visit: Payer: Self-pay

## 2023-03-21 ENCOUNTER — Encounter: Payer: Self-pay | Admitting: Professional Counselor

## 2023-03-21 ENCOUNTER — Ambulatory Visit: Payer: 59 | Admitting: Professional Counselor

## 2023-03-21 ENCOUNTER — Other Ambulatory Visit: Payer: Self-pay

## 2023-03-21 ENCOUNTER — Other Ambulatory Visit (HOSPITAL_COMMUNITY): Payer: Self-pay

## 2023-03-21 DIAGNOSIS — F4323 Adjustment disorder with mixed anxiety and depressed mood: Secondary | ICD-10-CM

## 2023-03-21 NOTE — Progress Notes (Signed)
 [facility] Counselor/Therapist Progress Note  Patient ID: Tara Lopez, MRN: [id number],    Date: 03/21/2023  Time Spent: 2:08 PM to 3:04 PM  Treatment Type: Individual Therapy  I connected with this patient by an approved telecommunication method (video), with her informed consent, and verifying identity and patient privacy.  I was located at my office and patient at her home.  As needed, we discussed the limitations, risks, and security and privacy concerns associated with telehealth service, including the availability and conditions which currently govern in-person appointments and the possibility that 3rd-party payment may not be fully guaranteed and she may be responsible for charges.  After she indicated understanding, we proceeded with the session.  Also discussed treatment planning, as needed, including ongoing verbal agreement with the plan, the opportunity to ask and answer all questions, her demonstrated understanding of instructions, and her readiness to call the office should symptoms worsen or she feels she is in a crisis state and needs more immediate and tangible assistance.   Reported Symptoms: Worries, preoccupying thoughts, sadness, grief/loss, stress, interpersonal concerns  Mental Status Exam:  Appearance:   Casual     Behavior:  Appropriate, Sharing, and Motivated  Motor:  Normal  Speech/Language:   Clear and Coherent and Normal Rate  Affect:  Tearful  Mood:  sad  Thought process:  normal  Thought content:    WNL  Sensory/Perceptual disturbances:    WNL  Orientation:  oriented to person, place, time/date, and situation  Attention:  Good  Concentration:  Good  Memory:  WNL  Fund of knowledge:   Good  Insight:    Good  Judgment:   Good  Impulse Control:  Good   Risk Assessment: Danger to Self:  No Self-injurious Behavior: No Danger to Others: No Duty to Warn:no Physical Aggression / Violence:No  Access to Firearms a concern: No  Gang Involvement:No    Subjective: Patient presented to session to address concerns of anxiety and depression.  She reported mixed progress at this time.  She identified progress in having approach difficult conversation with son about having him be more communicative when he is away for school; she reported conversation to have gone well, and to have enjoyed time with him on his break.  Patient processed challenges facing pending decision regarding dog's state of health and related mortality.  Patient processed grief and loss around her children growing up, and her life not looking as she thought it would, and therefore related sadness, but also gratitude for her children and their trajectories in life, and patient's sense of many blessings.  Counselor held space for grief, actively listened, affirmed patient feelings and experience and helped to facilitate insight around difficult decisions such as with dog's health, taking care of some things will allow for more energy towards others, such as care of her mother.  Counselor and patient continued to discuss patient treatment plan in spite of technical difficulties with EHR; patient gave her consent verbally and will sign upon future in person visit.  Interventions: Solution-Oriented/Positive Psychology, Humanistic/Existential, and Insight-Oriented  Diagnosis:   ICD-10-CM   1. Adjustment disorder with mixed anxiety and depressed mood  F43.23       Plan: Patient is scheduled for follow-up; continue process work and developing coping skills.  Patient to continue to discern regarding important decisions and family life, and worked to trust herself assessment of what is needed.  Progress note was dictated with Dragon and reviewed for accuracy.  Almarie DASEN  Ideal, [facility]

## 2023-03-31 DIAGNOSIS — M25511 Pain in right shoulder: Secondary | ICD-10-CM | POA: Diagnosis not present

## 2023-04-04 ENCOUNTER — Ambulatory Visit (INDEPENDENT_AMBULATORY_CARE_PROVIDER_SITE_OTHER): Payer: 59 | Admitting: Professional Counselor

## 2023-04-04 DIAGNOSIS — F4323 Adjustment disorder with mixed anxiety and depressed mood: Secondary | ICD-10-CM

## 2023-04-04 NOTE — Progress Notes (Signed)
      [facility] Counselor/Therapist Progress Note  Patient ID: ANNIBELLE BRAZIE, MRN: [id number],    Date: 01.17.2025  Time Spent: 2:08 PM to 3:05 PM  Treatment Type: Individual Therapy  I connected with this patient by an approved telecommunication method (video), with her informed consent, and verifying identity and patient privacy.  I was located at my office and patient at her home.  As needed, we discussed the limitations, risks, and security and privacy concerns associated with telehealth service, including the availability and conditions which currently govern in-person appointments and the possibility that 3rd-party payment may not be fully guaranteed and she may be responsible for charges.  After she indicated understanding, we proceeded with the session.  Also discussed treatment planning, as needed, including ongoing verbal agreement with the plan, the opportunity to ask and answer all questions, her demonstrated understanding of instructions, and her readiness to call the office should symptoms worsen or she feels she is in a crisis state and needs more immediate and tangible assistance.   Reported Symptoms: Tearfulness, sadness, loneliness, worries, interpersonal concerns, stress  Mental Status Exam:  Appearance:   Neat     Behavior:  Appropriate, Sharing, and Motivated  Motor:  Normal  Speech/Language:   Clear and Coherent and Normal Rate  Affect:  Tearful  Mood:  sad  Thought process:  normal  Thought content:    WNL  Sensory/Perceptual disturbances:    WNL  Orientation:  oriented to person, place, time/date, and situation  Attention:  Good  Concentration:  Good  Memory:  WNL  Fund of knowledge:   Good  Insight:    Good  Judgment:   Good  Impulse Control:  Good   Risk Assessment: Danger to Self:  No Self-injurious Behavior: No Danger to Others: No Duty to Warn:no Physical Aggression / Violence:No  Access to Firearms a concern: No  Gang Involvement:No    Subjective: Patient presented to session to address concerns of anxiety and depression.  She reported limited progress at this time.  Patient processed experience of eldest child returning to college and fight they had at the end of his visit, and reflected on possibility that this may have made it easier for him to leave.  She reflected on ways in which he has matured since his time away.  She reported family at to still be holding on and spite of debilitating health circumstance.  Patient reported challenging work issue to persist.  Counselor actively listened, helped facilitate insight, and affirmed patient feelings and experience.  Patient processed her sense of loneliness. She voiced struggle of wishing for more quality time with her children while also needing personal time, and counselor helped to normalize variable feelings and needs, and stress of single parenting.  Counselor helped with strategies for increasing connection opportunities with kids, and streamlining chore operations so as to limit conflict.  Interventions: Solution-Oriented/Positive Psychology, Humanistic/Existential, and Insight-Oriented  Diagnosis:   ICD-10-CM   1. Adjustment disorder with mixed anxiety and depressed mood  F43.23       Plan: Patient is scheduled for follow-up; continue process work and developing coping skills.  Patient to work on short-term goal between sessions of resourcing opportunities to meet dating prospects, and reflecting on strategies to optimize quality time with children.  Progress note was dictated with Dragon and reviewed for accuracy.  Gaspar Bidding, [facility]

## 2023-04-05 ENCOUNTER — Other Ambulatory Visit (HOSPITAL_COMMUNITY): Payer: Self-pay

## 2023-04-18 ENCOUNTER — Encounter: Payer: Self-pay | Admitting: Professional Counselor

## 2023-04-18 ENCOUNTER — Ambulatory Visit: Payer: 59 | Admitting: Professional Counselor

## 2023-05-02 ENCOUNTER — Encounter: Payer: Self-pay | Admitting: Professional Counselor

## 2023-05-02 ENCOUNTER — Ambulatory Visit: Payer: 59 | Admitting: Professional Counselor

## 2023-05-02 DIAGNOSIS — F4323 Adjustment disorder with mixed anxiety and depressed mood: Secondary | ICD-10-CM | POA: Diagnosis not present

## 2023-05-02 NOTE — Progress Notes (Addendum)
 [facility] Counselor/Therapist Progress Note  Patient ID: ZALIA HAUTALA, MRN: [id number],    Date: 2.14.2025  Time Spent: 2:07 PM to 3:09 PM  Treatment Type: Individual Therapy  I connected with this patient by an approved telecommunication method (video), with her informed consent, and verifying identity and patient privacy.  I was located at my office and patient at her home.  As needed, we discussed the limitations, risks, and security and privacy concerns associated with telehealth service, including the availability and conditions which currently govern in-person appointments and the possibility that 3rd-party payment may not be fully guaranteed and she may be responsible for charges.  After she indicated understanding, we proceeded with the session.  Also discussed treatment planning, as needed, including ongoing verbal agreement with the plan, the opportunity to ask and answer all questions, her demonstrated understanding of instructions, and her readiness to call the office should symptoms worsen or she feels she is in a crisis state and needs more immediate and tangible assistance.   Reported Symptoms: Worries, stress, parenting concerns, intermittent low mood, phase of life concerns  Mental Status Exam:  Appearance:   Neat     Behavior:  Appropriate, Sharing, and Motivated  Motor:  Normal  Speech/Language:   Clear and Coherent and Normal Rate  Affect:  Appropriate and Congruent  Mood:  normal  Thought process:  normal  Thought content:    WNL  Sensory/Perceptual disturbances:    WNL  Orientation:  oriented to person, place, time/date, and situation  Attention:  Good  Concentration:  Good  Memory:  WNL  Fund of knowledge:   Good  Insight:    Good  Judgment:   Good  Impulse Control:  Good   Risk Assessment: Danger to Self:  No Self-injurious Behavior: No Danger to Others: No Duty to Warn:no Physical Aggression / Violence:No  Access to Firearms a concern: No   Gang Involvement:No   Subjective: Patient presented to session to address concerns of anxiety and depression.  She reported progress at this time.  Patient voiced to be continuing to experience stress related to parenting and to be in the process of helping with scholarship applications.  Counselor actively listened and affirmed patient feelings and experience.  She reflected on developments with her mother's health and lab work that is indicative of Alzheimer's.  She processed preemptive grief and loss including as relates changes in her mother's behavior and quality of life, and counselor held space and assisted with preemptive grief processing.  Patient also processed experience of beloved pet dog maintaining life in spite of significant health concerns.  Counselor and patient discussed patient thoughts around engaging with online dating, and patient's reluctance given energetic capacity around this endeavor.  Counselor and patient again discussed and tried to sign patient treatment plan; patient was able to view and give her verbal consent, however details not populated into proper section for signing.  Counselor has alerted [facility] to the issue; resolution TBD.  Interventions: Solution-Oriented/Positive Psychology, Humanistic/Existential, and Insight-Oriented  Diagnosis:   ICD-10-CM   1. Adjustment disorder with mixed anxiety and depressed mood  F43.23       Plan: Patient is scheduled for follow-up; continue process work and developing coping skills.  Patient personal goal between sessions to continue to prioritize self-care and resourcing.  Progress note was dictated with Dragon and reviewed for accuracy.  Gaspar Bidding, [facility]

## 2023-05-21 ENCOUNTER — Other Ambulatory Visit: Payer: Self-pay | Admitting: Obstetrics and Gynecology

## 2023-05-21 DIAGNOSIS — Z0000 Encounter for general adult medical examination without abnormal findings: Secondary | ICD-10-CM

## 2023-05-27 ENCOUNTER — Other Ambulatory Visit (HOSPITAL_COMMUNITY): Payer: Self-pay

## 2023-05-30 ENCOUNTER — Ambulatory Visit: Payer: 59 | Admitting: Professional Counselor

## 2023-06-11 ENCOUNTER — Other Ambulatory Visit (HOSPITAL_COMMUNITY): Payer: Self-pay

## 2023-06-11 NOTE — Progress Notes (Signed)
Fixed, thanks!

## 2023-06-13 ENCOUNTER — Encounter: Payer: Self-pay | Admitting: Professional Counselor

## 2023-06-13 ENCOUNTER — Ambulatory Visit: Payer: 59 | Admitting: Professional Counselor

## 2023-06-13 DIAGNOSIS — F4323 Adjustment disorder with mixed anxiety and depressed mood: Secondary | ICD-10-CM

## 2023-06-13 NOTE — Progress Notes (Signed)
      [facility] Counselor/Therapist Progress Note  Patient ID: Tara Lopez, MRN: [id number],    Date: 06/13/2023  Time Spent: 3:13 PM to 4:11 PM  Treatment Type: Individual Therapy  I connected with this patient by an approved telecommunication method (video), with her informed consent, and verifying identity and patient privacy.  I was located at my office and patient at her home.  As needed, we discussed the limitations, risks, and security and privacy concerns associated with telehealth service, including the availability and conditions which currently govern in-person appointments and the possibility that 3rd-party payment may not be fully guaranteed and she may be responsible for charges.  After she indicated understanding, we proceeded with the session.  Also discussed treatment planning, as needed, including ongoing verbal agreement with the plan, the opportunity to ask and answer all questions, her demonstrated understanding of instructions, and her readiness to call the office should symptoms worsen or she feels she is in a crisis state and needs more immediate and tangible assistance.   Reported Symptoms: Tearfulness, intermittent low mood, worries, restlessness, parenting concerns  Mental Status Exam:  Appearance:   Casual     Behavior:  Appropriate, Sharing, and Motivated  Motor:  Normal  Speech/Language:   Clear and Coherent and Normal Rate  Affect:  Appropriate, Congruent, and Tearful  Mood:  sad  Thought process:  normal  Thought content:    WNL  Sensory/Perceptual disturbances:    WNL  Orientation:  oriented to person, place, time/date, and situation  Attention:  Good  Concentration:  Good  Memory:  WNL  Fund of knowledge:   Good  Insight:    Good  Judgment:   Good  Impulse Control:  Good   Risk Assessment: Danger to Self:  No Self-injurious Behavior: No Danger to Others: No Duty to Warn:no Physical Aggression / Violence:No  Access to Firearms a concern: No   Gang Involvement:No   Subjective: Patient presented to session to address concerns of anxiety and depression.  She reported mixed progress at this time.  She processed experience of continued parenting concerns, at this time regarding issues of respect and appreciation.  Counselor actively listened, affirmed patient feelings and experience, and helped patient develop strategies for improvement including empowering others to take responsibility for themselves and their behavior and how they impact others, and for natural consequences.  Patient reflected on resonance of sense of dismissiveness with treatment from ex husband and difficult feelings around these circumstances.  Counselor helped patient to resource her strengths including resiliency and self-awareness.  Interventions: Assertiveness/Communication, Solution-Oriented/Positive Psychology, Humanistic/Existential, and Insight-Oriented  Diagnosis:   ICD-10-CM   1. Adjustment disorder with mixed anxiety and depressed mood  F43.23       Plan: Patient is scheduled for follow-up; continue process work and developing coping skills.  Patient short-term goal between sessions to continue to implement strategies of self and other empowerment advocacy in parenting role.  Progress note was dictated with Dragon and reviewed for accuracy.  Anthon Kins, [facility]

## 2023-06-20 ENCOUNTER — Ambulatory Visit
Admission: RE | Admit: 2023-06-20 | Discharge: 2023-06-20 | Disposition: A | Discharge status: Home / Self Care | Payer: 59 | Source: Ambulatory Visit | Attending: Obstetrics and Gynecology | Admitting: Obstetrics and Gynecology

## 2023-06-20 DIAGNOSIS — Z0000 Encounter for general adult medical examination without abnormal findings: Secondary | ICD-10-CM

## 2023-06-20 DIAGNOSIS — Z1231 Encounter for screening mammogram for malignant neoplasm of breast: Secondary | ICD-10-CM | POA: Diagnosis not present

## 2023-06-27 ENCOUNTER — Ambulatory Visit: Payer: 59 | Admitting: Professional Counselor

## 2023-08-01 ENCOUNTER — Encounter: Payer: Self-pay | Admitting: Professional Counselor

## 2023-08-01 ENCOUNTER — Ambulatory Visit: Admitting: Professional Counselor

## 2023-08-01 DIAGNOSIS — F4323 Adjustment disorder with mixed anxiety and depressed mood: Secondary | ICD-10-CM | POA: Diagnosis not present

## 2023-08-01 NOTE — Progress Notes (Signed)
      [facility] Counselor/Therapist Progress Note  Patient ID: Tara Lopez, MRN: [id number],    Date: 08/01/2023  Time Spent: 11:15 AM to 11:59 AM  Treatment Type: Individual Therapy  I connected with this patient by an approved telecommunication method (video), with her informed consent, and verifying identity and patient privacy.  I was located at my office and patient at her home.  As needed, we discussed the limitations, risks, and security and privacy concerns associated with telehealth service, including the availability and conditions which currently govern in-person appointments and the possibility that 3rd-party payment may not be fully guaranteed and she may be responsible for charges.  After she indicated understanding, we proceeded with the session.  Also discussed treatment planning, as needed, including ongoing verbal agreement with the plan, the opportunity to ask and answer all questions, her demonstrated understanding of instructions, and her readiness to call the office should symptoms worsen or she feels she is in a crisis state and needs more immediate and tangible assistance.   Reported Symptoms: Sadness, tearfulness, low mood, worries, interpersonal concerns, phase of life concerns  Mental Status Exam:  Appearance:   Casual     Behavior:  Appropriate, Sharing, and Motivated  Motor:  Normal  Speech/Language:   Clear and Coherent and Normal Rate  Affect:  Tearful  Mood:  depressed and sad  Thought process:  normal  Thought content:    WNL  Sensory/Perceptual disturbances:    WNL  Orientation:  oriented to person, place, time/date, and situation  Attention:  Good  Concentration:  Good  Memory:  WNL  Fund of knowledge:   Good  Insight:    Good  Judgment:   Good  Impulse Control:  Good   Risk Assessment: Danger to Self:  No Self-injurious Behavior: No Danger to Others: No Duty to Warn:no Physical Aggression / Violence:No  Access to Firearms a concern: No   Gang Involvement:No   Subjective: Patient presented to session to address concerns of anxiety and depression.  She reported mixed progress at this time.  She processed experience of developments in family life, including milestones for her children.  She processed the experience of continued challenges with her ex-husband, and ways that dynamics and behaviors in the relationship impact her wellbeing, having resonance within family system.  Counselor actively listened, affirmed patient feelings and experience, and helped patient with strategies for improved communication and expressing of her needs, and in reflecting upon how she feels her cup.  Counselor provided psychoeducation and helped facilitate insight around developmental phase of youth as relates divorce impact and response pattern.  Interventions: Assertiveness/Communication, Solution-Oriented/Positive Psychology, Humanistic/Existential, and Insight-Oriented  Diagnosis:   ICD-10-CM   1. Adjustment disorder with mixed anxiety and depressed mood  F43.23       Plan: Patient is scheduled for follow-up; continue process work and developing coping skills.  STG between sessions for patient to practice voicing her needs with family members, and continue to resource time for herself, including pleasurable and restorative activities and relationships.  Progress note was dictated with Dragon and reviewed for accuracy.  Tara Lopez, [facility]

## 2023-08-07 DIAGNOSIS — M810 Age-related osteoporosis without current pathological fracture: Secondary | ICD-10-CM | POA: Diagnosis not present

## 2023-08-07 DIAGNOSIS — I10 Essential (primary) hypertension: Secondary | ICD-10-CM | POA: Diagnosis not present

## 2023-08-07 DIAGNOSIS — E559 Vitamin D deficiency, unspecified: Secondary | ICD-10-CM | POA: Diagnosis not present

## 2023-08-07 DIAGNOSIS — Z6827 Body mass index (BMI) 27.0-27.9, adult: Secondary | ICD-10-CM | POA: Diagnosis not present

## 2023-08-07 DIAGNOSIS — J309 Allergic rhinitis, unspecified: Secondary | ICD-10-CM | POA: Diagnosis not present

## 2023-08-07 DIAGNOSIS — Z13220 Encounter for screening for lipoid disorders: Secondary | ICD-10-CM | POA: Diagnosis not present

## 2023-08-07 DIAGNOSIS — Z0000 Encounter for general adult medical examination without abnormal findings: Secondary | ICD-10-CM | POA: Diagnosis not present

## 2023-08-07 DIAGNOSIS — E538 Deficiency of other specified B group vitamins: Secondary | ICD-10-CM | POA: Diagnosis not present

## 2023-08-07 DIAGNOSIS — Z1389 Encounter for screening for other disorder: Secondary | ICD-10-CM | POA: Diagnosis not present

## 2023-08-07 DIAGNOSIS — R7989 Other specified abnormal findings of blood chemistry: Secondary | ICD-10-CM | POA: Diagnosis not present

## 2023-08-07 DIAGNOSIS — R32 Unspecified urinary incontinence: Secondary | ICD-10-CM | POA: Diagnosis not present

## 2023-09-05 ENCOUNTER — Ambulatory Visit: Admitting: Professional Counselor

## 2023-09-05 DIAGNOSIS — R152 Fecal urgency: Secondary | ICD-10-CM | POA: Diagnosis not present

## 2023-09-05 DIAGNOSIS — N3941 Urge incontinence: Secondary | ICD-10-CM | POA: Diagnosis not present

## 2023-09-05 DIAGNOSIS — N8189 Other female genital prolapse: Secondary | ICD-10-CM | POA: Diagnosis not present

## 2023-09-23 DIAGNOSIS — D1801 Hemangioma of skin and subcutaneous tissue: Secondary | ICD-10-CM | POA: Diagnosis not present

## 2023-09-23 DIAGNOSIS — L814 Other melanin hyperpigmentation: Secondary | ICD-10-CM | POA: Diagnosis not present

## 2023-09-23 DIAGNOSIS — L578 Other skin changes due to chronic exposure to nonionizing radiation: Secondary | ICD-10-CM | POA: Diagnosis not present

## 2023-09-23 DIAGNOSIS — L821 Other seborrheic keratosis: Secondary | ICD-10-CM | POA: Diagnosis not present

## 2023-09-23 DIAGNOSIS — L7211 Pilar cyst: Secondary | ICD-10-CM | POA: Diagnosis not present

## 2023-09-23 DIAGNOSIS — L719 Rosacea, unspecified: Secondary | ICD-10-CM | POA: Diagnosis not present

## 2023-09-23 DIAGNOSIS — D229 Melanocytic nevi, unspecified: Secondary | ICD-10-CM | POA: Diagnosis not present

## 2023-09-23 DIAGNOSIS — L649 Androgenic alopecia, unspecified: Secondary | ICD-10-CM | POA: Diagnosis not present

## 2023-09-26 ENCOUNTER — Ambulatory Visit: Admitting: Professional Counselor

## 2023-09-29 ENCOUNTER — Other Ambulatory Visit (HOSPITAL_COMMUNITY): Payer: Self-pay

## 2023-09-29 ENCOUNTER — Other Ambulatory Visit: Payer: Self-pay

## 2023-09-29 MED ORDER — METOPROLOL SUCCINATE ER 25 MG PO TB24
25.0000 mg | ORAL_TABLET | Freq: Every day | ORAL | 0 refills | Status: AC
  Filled 2023-09-29: qty 90, 90d supply, fill #0

## 2023-10-09 NOTE — Progress Notes (Unsigned)
 Medical Nutrition Therapy  Appointment Start time:  [id number]  Appointment End time:  0912  Primary concerns today: weight gain with menopause  Referral diagnosis: employee visit 1 Preferred learning style:  no preference indicated Learning readiness: ready-change in progress   NUTRITION ASSESSMENT   Clinical Medical Hx:  Past Medical History:  Diagnosis Date   GERD (gastroesophageal reflux disease)     Medications:  Current Outpatient Medications:    alendronate  (FOSAMAX ) 70 MG tablet, Take 1 tablet (70 mg total) by mouth once a week., Disp: 12 tablet, Rfl: 4   Cholecalciferol (VITAMIN D) 50 MCG (2000 UT) tablet, 1 tablet, Disp: , Rfl:    metoprolol  succinate (TOPROL -XL) 25 MG 24 hr tablet, Take 1 tablet (25 mg total) by mouth daily., Disp: 90 tablet, Rfl: 2   Multiple Vitamin (MULTIVITAMIN WITH MINERALS) TABS tablet, Take 1 tablet by mouth daily., Disp: , Rfl:    progesterone  (PROMETRIUM ) 200 MG capsule, Take 1 capsule (200 mg total) by mouth at bedtime., Disp: 90 capsule, Rfl: 4   betamethasone dipropionate (DIPROLENE) 0.05 % cream, Take as directed., Disp: , Rfl:    buPROPion  (WELLBUTRIN  XL) 150 MG 24 hr tablet, Take 1 tablet by mouth in the morning, Disp: 30 tablet, Rfl: 2   cetirizine (ZYRTEC) 10 MG tablet, Take 10 mg by mouth daily., Disp: , Rfl:    desoximetasone (TOPICORT) 0.05 % cream, Apply 1 application topically 2 (two) times daily., Disp: , Rfl:    diclofenac  (VOLTAREN ) 50 MG EC tablet, Take 1 tablet (50 mg total) by mouth 2 (two) times daily as needed., Disp: 60 tablet, Rfl: 1   estradiol  (MINIVELLE ) 0.05 MG/24HR patch, Place 1 patch onto the skin 2 times a week., Disp: 24 patch, Rfl: 3   estradiol -norethindrone  (COMBIPATCH ) 0.05-0.25 MG/DAY, Apply 1 patch twice a week by transdermal route., Disp: 24 patch, Rfl: 4   Estradiol -Norethindrone  Acet 0.5-0.1 MG tablet, Take 1 tablet by mouth daily., Disp: 84 tablet, Rfl: 3   fluticasone  (FLONASE  ALLERGY RELIEF) 50 MCG/ACT nasal  spray, Use 1 spray in each nostril once a day, Disp: 48 g, Rfl: 0   fluticasone  (FLONASE ) 50 MCG/ACT nasal spray, Place 2 sprays into both nostrils daily., Disp: 16 g, Rfl: 6   hydrOXYzine  (ATARAX /VISTARIL ) 25 MG tablet, take 1 tablet by mouth at bedtime as needed (Patient not taking: Reported on 02/20/2021), Disp: 30 tablet, Rfl: 2   ketoconazole  (NIZORAL ) 2 % cream, Apply to affected area 2 times a day, Disp: 60 g, Rfl: 1   metoprolol  succinate (TOPROL -XL) 25 MG 24 hr tablet, Take 1 tablet by mouth once a day, Disp: 90 tablet, Rfl: 1   metoprolol  succinate (TOPROL -XL) 25 MG 24 hr tablet, Take 1 tablet (25 mg total) by mouth daily., Disp: 90 tablet, Rfl: 0   metroNIDAZOLE (METROCREAM) 0.75 % cream, As needed., Disp: , Rfl:    montelukast  (SINGULAIR ) 10 MG tablet, Take 1 tablet (10 mg total) by mouth daily., Disp: 90 tablet, Rfl: 0   Omega-3 Fatty Acids (FISH OIL) 1000 MG CAPS, 1 capsule, Disp: , Rfl:    omeprazole (PRILOSEC) 40 MG capsule, 1 capsule 30 minutes before morning meal, Disp: , Rfl:    Probiotic Product (PROBIOTIC FORMULA PO), Take 1 tablet by mouth daily., Disp: , Rfl:   Labs:  BP Readings from Last 3 Encounters:  05/21/22 126/85  02/20/21 136/88  02/19/21 94/73   No results found for: CHOL, HDL, LDLCALC, LDLDIRECT, TRIG, CHOLHDL No results found for: YHAJ8R Last vitamin D  No results found for: 25OHVITD2, 25OHVITD3, VD25OH Notable Signs/Symptoms:  Wt Readings from Last 3 Encounters:  05/21/22 160 lb (72.6 kg)  02/20/21 154 lb 15.7 oz (70.3 kg)  02/19/21 155 lb (70.3 kg)   Lifestyle & Dietary Hx Pt presents today alone. Pt c/o weight gain around mid section. Pt states she is in menopause. Pt c/o elevation in blood pressure. Pt reports full time mostly sitting. Pt reports she does the cooking and shopping. Pt reports she had a DEXA scan that indicated osteopenia and osteoporosis. Pt reports she began taking a MVI with calcium. Pt reports HRT patch in place.  Pt reports trying the following to address her health by increasing protein, taking MVI with calcium and using a weighted vest when walking. Pt reports her appetite is not well controlled stating some days  her choices do not satisfy her completely. Pt reports weather as a barrier to physical activity participation. Pt reports running decreased with hip pain and she would like to restart PA. Pt reports she is considered going vegetarian as her youngest daughter is plant based. Pt reports selecting sea salt. All Pt's questions were answered during this encounter.  Estimated daily fluid intake: not enough 45 oz Supplements: MVI with calcium, collagen peptides, Sleep: improving on average 7 hours nightly  Stress / self-care: 9 out of 10 / self care includes: vacation, meditation, wine Current average weekly physical activity: 3 days weekly walk with weighted vest 30-60 minutes  24-Hr Dietary Recall First Meal: coffee with added collagen, flavored greek yogurt, handful berries, 1/4 cup of home made granola and nuts or eggs with cheese, 2 slices of sour dough  Snack: none Second Meal: egg salad made with mayo, 2 slices of sour dough, micro greens, 2 handfuls of blue berries, water  Snack: none Third Meal: 3 dumplings made with tofu, spinach, onions, carrots, soy sauce, rice noodle wrap Snack: none Beverages: water, coffee with added collagen  NUTRITION DIAGNOSIS  NB-1.1 Food and nutrition-related knowledge deficit As related to no prior nutrition related education.  As evidenced by Pt reports and dietary recall.  NUTRITION INTERVENTION  Nutrition education (E-1) on the following topics:  Fruits & Vegetables: Aim to fill half your plate with a variety of fruits and vegetables. They are rich in vitamins, minerals, and fiber, and can help reduce the risk of chronic diseases. Choose a colorful assortment of fruits and vegetables to ensure you get a wide range of nutrients. Grains and Starches: Make  at least half of your grain choices whole grains, such as brown rice, whole wheat bread, and oats. Whole grains provide fiber, which aids in digestion and healthy cholesterol levels. Aim for whole forms of starchy vegetables such as potatoes, sweet potatoes, beans, peas, and corn, which are fiber rich and provide many vitamins and minerals.  Protein: Incorporate lean sources of protein, such as tofu, vegetable protein, beans, nuts, and seeds, into your meals. Protein is essential for building and repairing tissues, staying full, balancing blood sugar, as well as supporting immune function. Dairy: Include low-fat or fat-free dairy products like milk, yogurt, and cheese in your diet. Dairy foods are excellent sources of calcium and vitamin D, which are crucial for bone health.  Physical Activity: Aim for 60 minutes of physical activity daily. Regular physical activity promotes overall health-including helping to reduce risk for heart disease and diabetes, promoting mental health, and helping us  sleep better.   Handouts Provided Include  Fruits & Vegetables: Aim to fill half your plate  with a variety of fruits and vegetables. They are rich in vitamins, minerals, and fiber, and can help reduce the risk of chronic diseases. Choose a colorful assortment of fruits and vegetables to ensure you get a wide range of nutrients. Grains and Starches: Make at least half of your grain choices whole grains, such as brown rice, whole wheat bread, and oats. Whole grains provide fiber, which aids in digestion and healthy cholesterol levels. Aim for whole forms of starchy vegetables such as potatoes, sweet potatoes, beans, peas, and corn, which are fiber rich and provide many vitamins and minerals.  Protein: Incorporate lean sources of protein, such as poultry, fish, beans, nuts, and seeds, into your meals. Protein is essential for building and repairing tissues, staying full, balancing blood sugar, as well as supporting immune  function. Dairy: Include low-fat or fat-free dairy products like milk, yogurt, and cheese in your diet. Dairy foods are excellent sources of calcium and vitamin D, which are crucial for bone health.  Physical Activity: Aim for 60 minutes of physical activity daily. Regular physical activity promotes overall health-including helping to reduce risk for heart disease and diabetes, promoting mental health, and helping us  sleep better.   Learning Style & Readiness for Change Teaching method utilized: Visual & Auditory  Demonstrated degree of understanding via: Teach Back  Barriers to learning/adherence to lifestyle change: weather, pain  Goals Established by Pt Maintain walking 3 days weekly  Protein at each meal time 3 ounces or 21 grams per meal    MONITORING & EVALUATION Dietary intake, weekly physical activity  Next Steps  Patient is to return: 11/28/2023

## 2023-10-10 ENCOUNTER — Encounter: Attending: Family Medicine | Admitting: Dietician

## 2023-10-10 DIAGNOSIS — E663 Overweight: Secondary | ICD-10-CM | POA: Insufficient documentation

## 2023-10-17 ENCOUNTER — Other Ambulatory Visit (HOSPITAL_COMMUNITY): Payer: Self-pay

## 2023-10-17 ENCOUNTER — Other Ambulatory Visit: Payer: Self-pay

## 2023-10-17 DIAGNOSIS — Z01419 Encounter for gynecological examination (general) (routine) without abnormal findings: Secondary | ICD-10-CM | POA: Diagnosis not present

## 2023-10-17 DIAGNOSIS — Z1151 Encounter for screening for human papillomavirus (HPV): Secondary | ICD-10-CM | POA: Diagnosis not present

## 2023-10-17 DIAGNOSIS — Z79890 Hormone replacement therapy (postmenopausal): Secondary | ICD-10-CM | POA: Diagnosis not present

## 2023-10-17 DIAGNOSIS — Z124 Encounter for screening for malignant neoplasm of cervix: Secondary | ICD-10-CM | POA: Diagnosis not present

## 2023-10-17 DIAGNOSIS — Z6828 Body mass index (BMI) 28.0-28.9, adult: Secondary | ICD-10-CM | POA: Diagnosis not present

## 2023-10-17 MED ORDER — ESTRADIOL 0.0375 MG/24HR TD PTTW
1.0000 | MEDICATED_PATCH | TRANSDERMAL | 1 refills | Status: AC
  Filled 2023-10-17: qty 24, 84d supply, fill #0

## 2023-10-17 MED ORDER — ESTRADIOL 0.05 MG/24HR TD PTTW
1.0000 | MEDICATED_PATCH | TRANSDERMAL | 3 refills | Status: DC
  Filled 2023-10-17: qty 24, 84d supply, fill #0
  Filled 2023-12-09: qty 8, 28d supply, fill #0

## 2023-10-17 MED ORDER — PROGESTERONE 200 MG PO CAPS
200.0000 mg | ORAL_CAPSULE | Freq: Every day | ORAL | 3 refills | Status: AC
  Filled 2023-10-17: qty 90, 90d supply, fill #0

## 2023-10-20 ENCOUNTER — Other Ambulatory Visit: Payer: Self-pay

## 2023-10-21 ENCOUNTER — Encounter: Payer: Self-pay | Admitting: Pharmacist

## 2023-10-21 ENCOUNTER — Other Ambulatory Visit: Payer: Self-pay

## 2023-10-24 ENCOUNTER — Ambulatory Visit: Admitting: Professional Counselor

## 2023-10-24 DIAGNOSIS — F4323 Adjustment disorder with mixed anxiety and depressed mood: Secondary | ICD-10-CM | POA: Diagnosis not present

## 2023-10-26 IMAGING — MG DIGITAL DIAGNOSTIC BILAT W/ TOMO W/ CAD
6 of 10 series · 6 of 30 positions shown · non-contrast
Comparison: Previous exam(s).

CLINICAL DATA: Intermittent focal pain in the upper outer right
breast beginning 3 months ago. She has not had pain for the past
month.

EXAM:
DIGITAL DIAGNOSTIC BILATERAL MAMMOGRAM WITH TOMOSYNTHESIS AND CAD;
ULTRASOUND RIGHT BREAST LIMITED
TECHNIQUE: Bilateral digital diagnostic mammography and breast tomosynthesis
was performed. The images were evaluated with computer-aided
detection.; Targeted ultrasound examination of the right breast was
performed

[L MLO synth-2D]
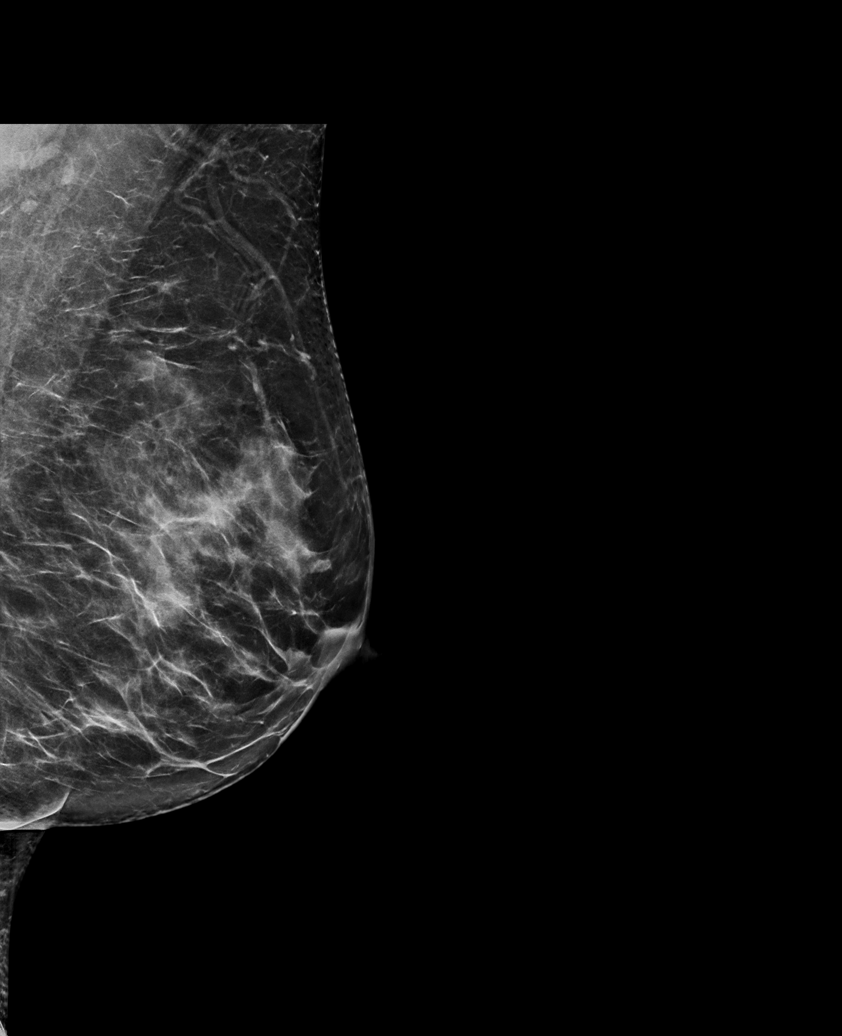

[R TAN synth-2D]
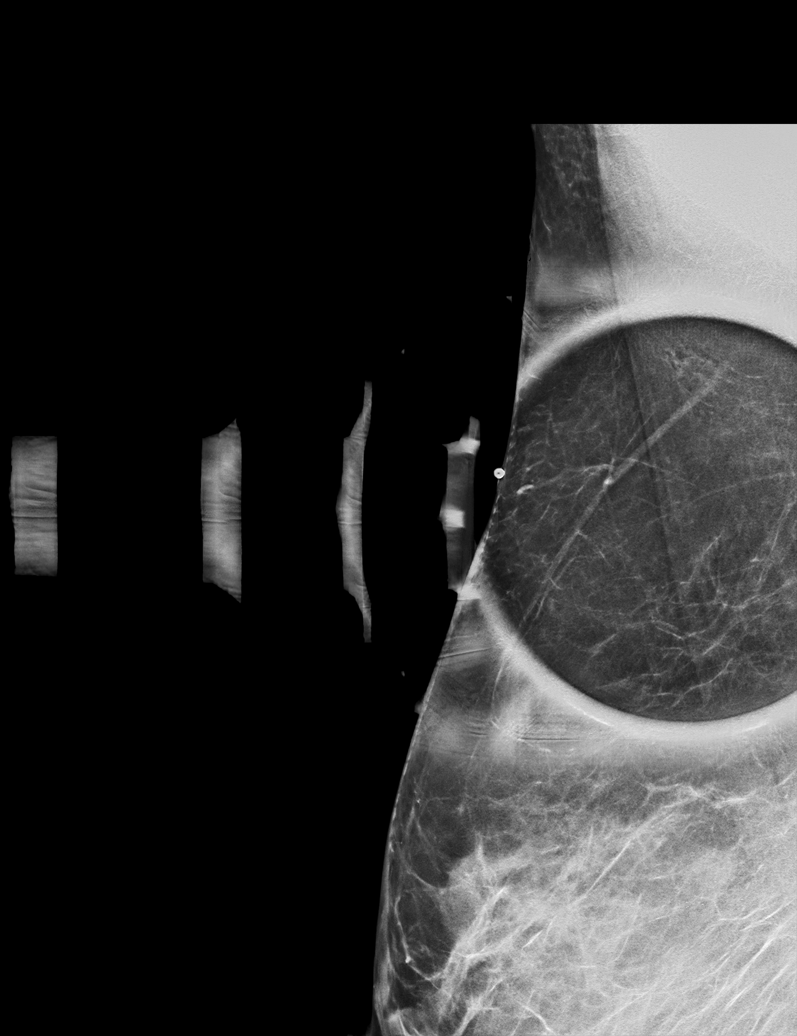

[L CC synth-2D]
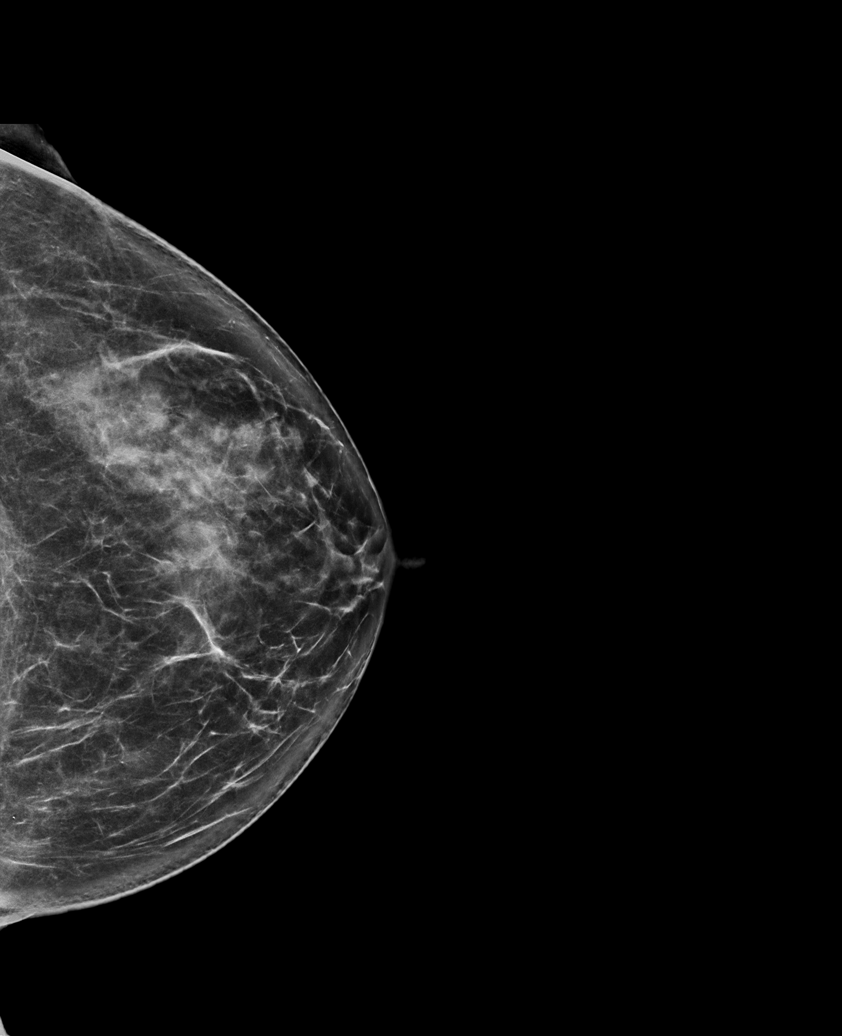

[R MLO synth-2D]
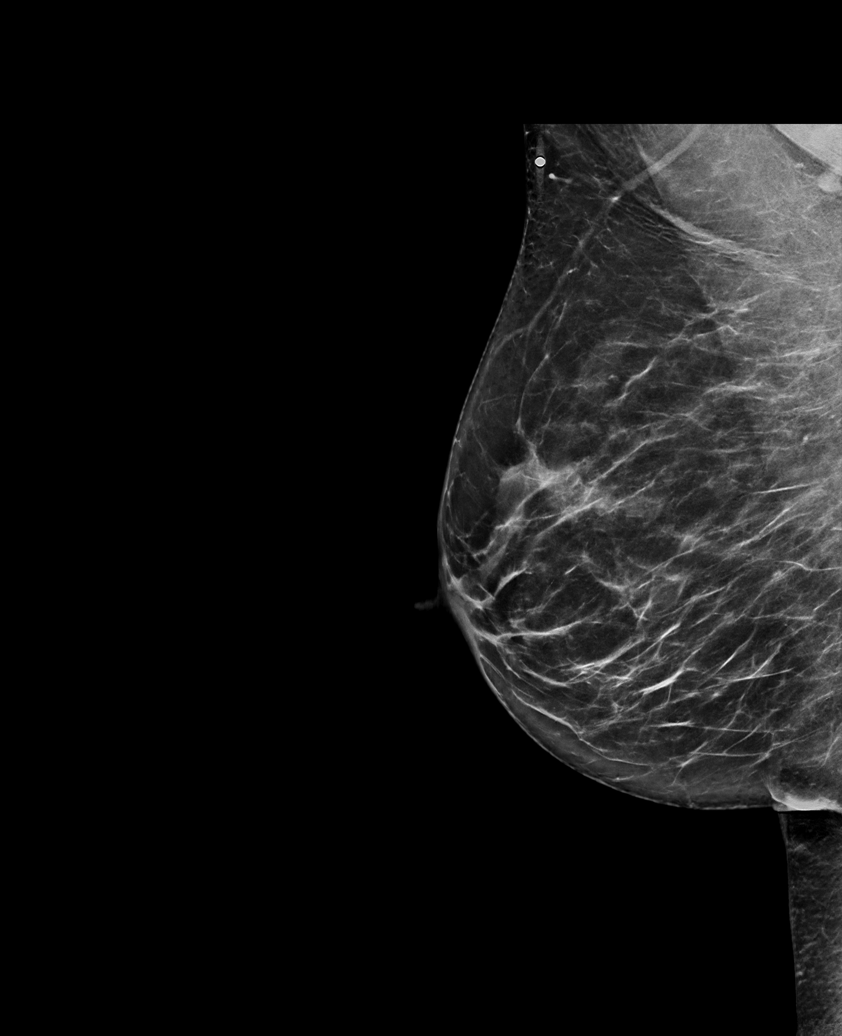

[R CC synth-2D]
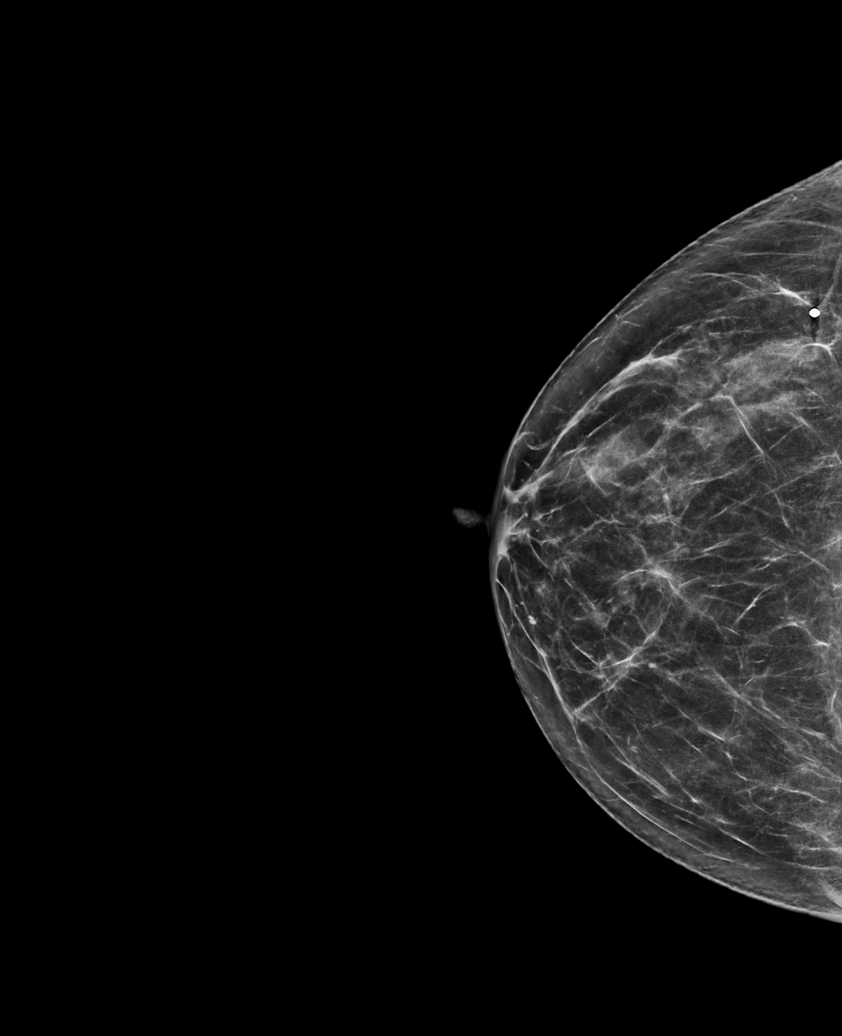

[R MLO tomo · tomo slice 39/77.0]
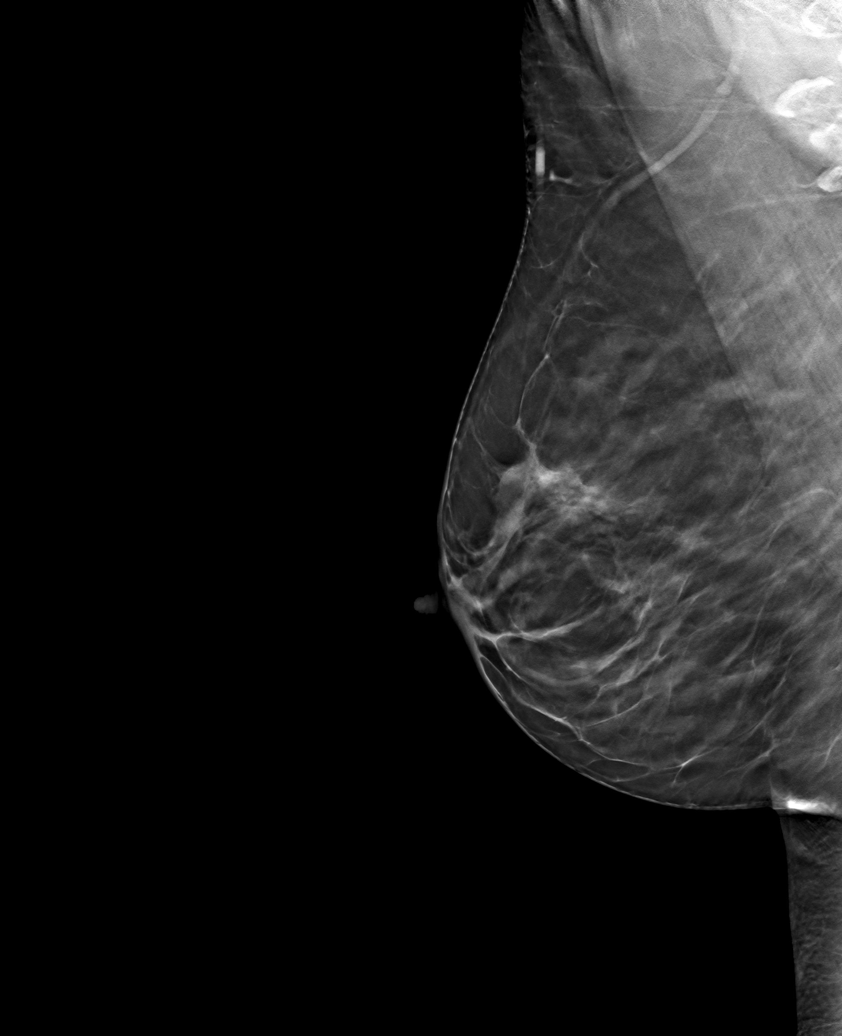

[6 of 30 positions shown; findings below may reference images not displayed]

ACR Breast Density Category b: There are scattered areas of
fibroglandular density.
FINDINGS: Stable mammographic appearance of the breasts with no interval
findings suspicious for malignancy in either breast.

Targeted ultrasound is performed, showing normal appearing breast
tissue throughout the upper outer right breast with no mass or other
findings suspicious for malignancy.
IMPRESSION: No evidence of malignancy.

RECOMMENDATION:
Bilateral screening mammogram in 1 year.

I have discussed the findings and recommendations with the patient.
If applicable, a reminder letter will be sent to the patient
regarding the next appointment.

BI-RADS CATEGORY  1: Negative.

## 2023-10-30 ENCOUNTER — Encounter: Payer: Self-pay | Admitting: Professional Counselor

## 2023-10-30 NOTE — Progress Notes (Signed)
 [facility] Counselor/Therapist Progress Note  Patient ID: Tara Lopez, MRN: [id number],    Date: 8.8.2025  Time Spent: 2:14 PM to 3:12 PM  Treatment Type: Individual Therapy  I connected with this patient by an approved telecommunication method (video), with her informed consent, and verifying identity and patient privacy.  I was located at my office and patient at her home.  As needed, we discussed the limitations, risks, and security and privacy concerns associated with telehealth service, including the availability and conditions which currently govern in-person appointments and the possibility that 3rd-party payment may not be fully guaranteed and she may be responsible for charges.  After she indicated understanding, we proceeded with the session.  Also discussed treatment planning, as needed, including ongoing verbal agreement with the plan, the opportunity to ask and answer all questions, her demonstrated understanding of instructions, and her readiness to call the office should symptoms worsen or she feels she is in a crisis state and needs more immediate and tangible assistance.   Reported Symptoms: Worries, preoccupying thoughts, sadness, frustration, interpersonal concerns, stress, fatigue  Mental Status Exam:  Appearance:   Casual     Behavior:  Appropriate and Sharing  Motor:  Normal  Speech/Language:   Clear and Coherent and Normal Rate  Affect:  Appropriate and Congruent  Mood:  normal  Thought process:  normal  Thought content:    WNL  Sensory/Perceptual disturbances:    WNL  Orientation:  oriented to person, place, time/date, and situation  Attention:  Good  Concentration:  Good  Memory:  WNL  Fund of knowledge:   Good  Insight:    Good  Judgment:   Good  Impulse Control:  Good   Risk Assessment: Danger to Self:  No Self-injurious Behavior: No Danger to Others: No Duty to Warn:no Physical Aggression / Violence:No  Access to Firearms a concern: No   Gang Involvement:No   Subjective: Patient presented to session to address concerns of anxiety and depression.  She reported mixed progress at this time.  She reported having had to put her dog to sleep, a decision that she had been deliberating about for several months.  She also processed experience of a work colleague having left their role, which has been positive given challenging dynamics in the workplace.  Patient processed the experience of her ex-husband having made changes where his support is concerned, and this creating potential conversations within family.  Counselor assisted patient in developing strategies towards approach of challenging conversation, and help to reinforce patient sense of her strengths and value, and need to share responsibility with others where appropriate, and to relieve herself of sense of guilt.  Patient also processed her mother's dementia which is progressing, and her mother having recently fallen and suffered broken bones.  She identified need for increased caregiving of mother, and processed these challenges.  Counselor actively listened, affirmed patient feelings and experience.  Interventions: Assertiveness/Communication, Solution-Oriented/Positive Psychology, Humanistic/Existential, and Insight-Oriented  Diagnosis:   ICD-10-CM   1. Adjustment disorder with mixed anxiety and depressed mood  F43.23       Plan: Patient is scheduled for follow-up; continue process work and developing coping skills.  STG between sessions to continue to resource positive self affirmations and nurture resiliency and interpersonal concerns, implementing boundaries and considering approach to difficult conversations utilizing interpersonal effectiveness strategies discussed in session.  Tara Lopez, [facility]

## 2023-11-18 ENCOUNTER — Ambulatory Visit (HOSPITAL_BASED_OUTPATIENT_CLINIC_OR_DEPARTMENT_OTHER)

## 2023-11-18 ENCOUNTER — Encounter (HOSPITAL_BASED_OUTPATIENT_CLINIC_OR_DEPARTMENT_OTHER): Payer: Self-pay

## 2023-11-18 ENCOUNTER — Other Ambulatory Visit (HOSPITAL_COMMUNITY): Payer: Self-pay | Admitting: Family Medicine

## 2023-11-18 DIAGNOSIS — R319 Hematuria, unspecified: Secondary | ICD-10-CM | POA: Diagnosis not present

## 2023-11-20 NOTE — Progress Notes (Deleted)
 Medical Nutrition Therapy  Appointment Start time:  ***  Appointment End time: ***  Primary concerns today: weight gain with menopause  Referral diagnosis: employee visit 1 *** Preferred learning style:  no preference indicated Learning readiness: ready-change in progress   NUTRITION ASSESSMENT   Clinical Medical Hx:  Past Medical History:  Diagnosis Date   GERD (gastroesophageal reflux disease)     Medications:  Current Outpatient Medications:    alendronate  (FOSAMAX ) 70 MG tablet, Take 1 tablet (70 mg total) by mouth once a week., Disp: 12 tablet, Rfl: 4   betamethasone dipropionate (DIPROLENE) 0.05 % cream, Take as directed., Disp: , Rfl:    buPROPion  (WELLBUTRIN  XL) 150 MG 24 hr tablet, Take 1 tablet by mouth in the morning, Disp: 30 tablet, Rfl: 2   cetirizine (ZYRTEC) 10 MG tablet, Take 10 mg by mouth daily., Disp: , Rfl:    Cholecalciferol (VITAMIN D) 50 MCG (2000 UT) tablet, 1 tablet, Disp: , Rfl:    desoximetasone (TOPICORT) 0.05 % cream, Apply 1 application topically 2 (two) times daily., Disp: , Rfl:    diclofenac  (VOLTAREN ) 50 MG EC tablet, Take 1 tablet (50 mg total) by mouth 2 (two) times daily as needed., Disp: 60 tablet, Rfl: 1   estradiol  (MINIVELLE ) 0.05 MG/24HR patch, Place 1 patch onto the skin 2 times a week., Disp: 24 patch, Rfl: 3   estradiol  (VIVELLE -DOT) 0.0375 MG/24HR, Place 1 patch onto the skin 2 (two) times a week., Disp: 24 patch, Rfl: 1   estradiol -norethindrone  (COMBIPATCH ) 0.05-0.25 MG/DAY, Apply 1 patch twice a week by transdermal route., Disp: 24 patch, Rfl: 4   Estradiol -Norethindrone  Acet 0.5-0.1 MG tablet, Take 1 tablet by mouth daily., Disp: 84 tablet, Rfl: 3   fluticasone  (FLONASE  ALLERGY RELIEF) 50 MCG/ACT nasal spray, Use 1 spray in each nostril once a day, Disp: 48 g, Rfl: 0   fluticasone  (FLONASE ) 50 MCG/ACT nasal spray, Place 2 sprays into both nostrils daily., Disp: 16 g, Rfl: 6   hydrOXYzine  (ATARAX /VISTARIL ) 25 MG tablet, take 1 tablet  by mouth at bedtime as needed (Patient not taking: Reported on 02/20/2021), Disp: 30 tablet, Rfl: 2   ketoconazole  (NIZORAL ) 2 % cream, Apply to affected area 2 times a day, Disp: 60 g, Rfl: 1   metoprolol  succinate (TOPROL -XL) 25 MG 24 hr tablet, Take 1 tablet by mouth once a day, Disp: 90 tablet, Rfl: 1   metoprolol  succinate (TOPROL -XL) 25 MG 24 hr tablet, Take 1 tablet (25 mg total) by mouth daily., Disp: 90 tablet, Rfl: 2   metoprolol  succinate (TOPROL -XL) 25 MG 24 hr tablet, Take 1 tablet (25 mg total) by mouth daily., Disp: 90 tablet, Rfl: 0   metroNIDAZOLE (METROCREAM) 0.75 % cream, As needed., Disp: , Rfl:    montelukast  (SINGULAIR ) 10 MG tablet, Take 1 tablet (10 mg total) by mouth daily., Disp: 90 tablet, Rfl: 0   Multiple Vitamin (MULTIVITAMIN WITH MINERALS) TABS tablet, Take 1 tablet by mouth daily., Disp: , Rfl:    Omega-3 Fatty Acids (FISH OIL) 1000 MG CAPS, 1 capsule, Disp: , Rfl:    omeprazole (PRILOSEC) 40 MG capsule, 1 capsule 30 minutes before morning meal, Disp: , Rfl:    Probiotic Product (PROBIOTIC FORMULA PO), Take 1 tablet by mouth daily., Disp: , Rfl:    progesterone  (PROMETRIUM ) 200 MG capsule, Take 1 capsule (200 mg total) by mouth at bedtime., Disp: 90 capsule, Rfl: 4   progesterone  (PROMETRIUM ) 200 MG capsule, Take 1 capsule (200 mg total) by mouth at  bedtime., Disp: 90 capsule, Rfl: 3  Labs:  BP Readings from Last 3 Encounters:  05/21/22 126/85  02/20/21 136/88  02/19/21 94/73   No results found for: CHOL, HDL, LDLCALC, LDLDIRECT, TRIG, CHOLHDL No results found for: YHAJ8R Last vitamin D No results found for: 25OHVITD2, 25OHVITD3, VD25OH Notable Signs/Symptoms:  Wt Readings from Last 3 Encounters:  05/21/22 160 lb (72.6 kg)  02/20/21 154 lb 15.7 oz (70.3 kg)  02/19/21 155 lb (70.3 kg)   BMI Readings from Last 3 Encounters:  05/21/22 25.82 kg/m  02/20/21 25.01 kg/m  02/19/21 25.02 kg/m    Lifestyle & Dietary Hx Pt presents today  alone. Pt c/o weight gain around mid section. Pt states she is in menopause. Pt c/o elevation in blood pressure. Pt reports full time mostly sitting. Pt reports she does the cooking and shopping. Pt reports she had a DEXA scan that indicated osteopenia and osteoporosis. Pt reports she began taking a MVI with calcium. Pt reports HRT patch in place. Pt reports trying the following to address her health by increasing protein, taking MVI with calcium and using a weighted vest when walking. Pt reports her appetite is not well controlled stating some days  her choices do not satisfy her completely. Pt reports weather as a barrier to physical activity participation. Pt reports running decreased with hip pain and she would like to restart PA. Pt reports she is considered going vegetarian as her youngest daughter is plant based. Pt reports selecting sea salt. All Pt's questions were answered during this encounter.  Estimated daily fluid intake: not enough 45 oz Supplements: MVI with calcium, collagen peptides, Sleep: improving on average 7 hours nightly  Stress / self-care: 9 out of 10 / self care includes: vacation, meditation, wine Current average weekly physical activity: 3 days weekly walk with weighted vest 30-60 minutes  24-Hr Dietary Recall First Meal: coffee with added collagen, flavored greek yogurt, handful berries, 1/4 cup of home made granola and nuts or eggs with cheese, 2 slices of sour dough  Snack: none Second Meal: egg salad made with mayo, 2 slices of sour dough, micro greens, 2 handfuls of blue berries, water  Snack: none Third Meal: 3 dumplings made with tofu, spinach, onions, carrots, soy sauce, rice noodle wrap Snack: none Beverages: water, coffee with added collagen  NUTRITION DIAGNOSIS  NB-1.1 Food and nutrition-related knowledge deficit As related to no prior nutrition related education.  As evidenced by Pt reports and dietary recall.  NUTRITION INTERVENTION  Nutrition  education (E-1) on the following topics:  Fruits & Vegetables: Aim to fill half your plate with a variety of fruits and vegetables. They are rich in vitamins, minerals, and fiber, and can help reduce the risk of chronic diseases. Choose a colorful assortment of fruits and vegetables to ensure you get a wide range of nutrients. Grains and Starches: Make at least half of your grain choices whole grains, such as brown rice, whole wheat bread, and oats. Whole grains provide fiber, which aids in digestion and healthy cholesterol levels. Aim for whole forms of starchy vegetables such as potatoes, sweet potatoes, beans, peas, and corn, which are fiber rich and provide many vitamins and minerals.  Protein: Incorporate lean sources of protein, such as tofu, vegetable protein, beans, nuts, and seeds, into your meals. Protein is essential for building and repairing tissues, staying full, balancing blood sugar, as well as supporting immune function. Dairy: Include low-fat or fat-free dairy products like milk, yogurt, and cheese in your diet.  Dairy foods are excellent sources of calcium and vitamin D, which are crucial for bone health.  Physical Activity: Aim for 60 minutes of physical activity daily. Regular physical activity promotes overall health-including helping to reduce risk for heart disease and diabetes, promoting mental health, and helping us  sleep better.   Handouts Provided Include  Fruits & Vegetables: Aim to fill half your plate with a variety of fruits and vegetables. They are rich in vitamins, minerals, and fiber, and can help reduce the risk of chronic diseases. Choose a colorful assortment of fruits and vegetables to ensure you get a wide range of nutrients. Grains and Starches: Make at least half of your grain choices whole grains, such as brown rice, whole wheat bread, and oats. Whole grains provide fiber, which aids in digestion and healthy cholesterol levels. Aim for whole forms of starchy  vegetables such as potatoes, sweet potatoes, beans, peas, and corn, which are fiber rich and provide many vitamins and minerals.  Protein: Incorporate lean sources of protein, such as poultry, fish, beans, nuts, and seeds, into your meals. Protein is essential for building and repairing tissues, staying full, balancing blood sugar, as well as supporting immune function. Dairy: Include low-fat or fat-free dairy products like milk, yogurt, and cheese in your diet. Dairy foods are excellent sources of calcium and vitamin D, which are crucial for bone health.  Physical Activity: Aim for 60 minutes of physical activity daily. Regular physical activity promotes overall health-including helping to reduce risk for heart disease and diabetes, promoting mental health, and helping us  sleep better.   Learning Style & Readiness for Change Teaching method utilized: Visual & Auditory  Demonstrated degree of understanding via: Teach Back  Barriers to learning/adherence to lifestyle change: weather, pain  Goals Established by Pt Maintain walking 3 days weekly  Protein at each meal time 3 ounces or 21 grams per meal    MONITORING & EVALUATION Dietary intake, weekly physical activity  Next Steps  Patient is to return: 11/28/2023

## 2023-11-28 ENCOUNTER — Ambulatory Visit: Admitting: Dietician

## 2023-11-28 ENCOUNTER — Ambulatory Visit: Admitting: Professional Counselor

## 2023-11-28 ENCOUNTER — Encounter: Payer: Self-pay | Admitting: Professional Counselor

## 2023-11-28 DIAGNOSIS — F4323 Adjustment disorder with mixed anxiety and depressed mood: Secondary | ICD-10-CM

## 2023-11-28 DIAGNOSIS — E663 Overweight: Secondary | ICD-10-CM

## 2023-11-28 NOTE — Progress Notes (Unsigned)
      [facility] Counselor/Therapist Progress Note  Patient ID: Tara Lopez, MRN: [id number],    Date: 11/28/2023  Time Spent: 2:16 PM to 3:14 PM  Treatment Type: Individual Therapy  I connected with this patient by an approved telecommunication method (video), with her informed consent, and verifying identity and patient privacy.  I was located at my office and patient at her office.  As needed, we discussed the limitations, risks, and security and privacy concerns associated with telehealth service, including the availability and conditions which currently govern in-person appointments and the possibility that 3rd-party payment may not be fully guaranteed and she may be responsible for charges.  After she indicated understanding, we proceeded with the session.  Also discussed treatment planning, as needed, including ongoing verbal agreement with the plan, the opportunity to ask and answer all questions, her demonstrated understanding of instructions, and her readiness to call the office should symptoms worsen or she feels she is in a crisis state and needs more immediate and tangible assistance.   Reported Symptoms: Stress, worries, interpersonal concerns, intermittent low mood, frustration, phase of life concerns, parenting concerns  Mental Status Exam:  Appearance:   Neat     Behavior:  Appropriate, Sharing, and Motivated  Motor:  Normal  Speech/Language:   Clear and Coherent and Normal Rate  Affect:  Appropriate and Congruent  Mood:  normal  Thought process:  normal  Thought content:    WNL  Sensory/Perceptual disturbances:    WNL  Orientation:  oriented to person, place, time/date, and situation  Attention:  Good  Concentration:  Good  Memory:  WNL  Fund of knowledge:   Good  Insight:    Good  Judgment:   Good  Impulse Control:  Good   Risk Assessment: Danger to Self:  No Self-injurious Behavior: No Danger to Others: No Duty to Warn:no Physical Aggression / Violence:No   Access to Firearms a concern: No  Gang Involvement:No   Subjective: Patient presented to session to address concerns of anxiety and depression.  She reported mixed progress at this time.  Patient processed experience of having dropped off her middle child to college, and how she feels she grieved him leaving over the summer, which made the transition somewhat easier.  She identified time with youngest child in the home as positive.  Patient processed the history of her marriage by history, and hurtfulness she has experienced.  She processed experience of current issues including limited child support, and emails of an inappropriate nature.  Counselor actively listened, affirmed patient feelings and experience, and reinforced patient self advocacy.  Patient processed experience of challenging conversation regarding sensitive topic with son per strategies discussed prior session.  Counselor affirmed patient proactive efforts.  Patient processed experience of her mother's dementia worsening.  Counselor recommended book 36-hour day as resource.  Interventions: Solution-Oriented/Positive Psychology, Humanistic/Existential, and Insight-Oriented  Diagnosis:   ICD-10-CM   1. Adjustment disorder with mixed anxiety and depressed mood  F43.23       Plan: Patient is scheduled for follow-up; continue process work and developing coping skills.  Discuss and renew patient treatment plan.  Patient STG between sessions to actively self advocate including holistic protections as needed.  Tara Lopez, [facility], [place]

## 2023-12-05 NOTE — Progress Notes (Signed)
 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 Preferred learning style:  no preference indicated Learning readiness: ready-change in progress   NUTRITION ASSESSMENT   Clinical Medical Hx:  Past Medical History:  Diagnosis Date   GERD (gastroesophageal reflux disease)     Medications:  Current Outpatient Medications:    alendronate  (FOSAMAX ) 70 MG tablet, Take 1 tablet (70 mg total) by mouth once a week., Disp: 12 tablet, Rfl: 4   betamethasone dipropionate (DIPROLENE) 0.05 % cream, Take as directed., Disp: , Rfl:    buPROPion  (WELLBUTRIN  XL) 150 MG 24 hr tablet, Take 1 tablet by mouth in the morning, Disp: 30 tablet, Rfl: 2   cetirizine (ZYRTEC) 10 MG tablet, Take 10 mg by mouth daily., Disp: , Rfl:    Cholecalciferol (VITAMIN D) 50 MCG (2000 UT) tablet, 1 tablet, Disp: , Rfl:    desoximetasone (TOPICORT) 0.05 % cream, Apply 1 application topically 2 (two) times daily., Disp: , Rfl:    diclofenac  (VOLTAREN ) 50 MG EC tablet, Take 1 tablet (50 mg total) by mouth 2 (two) times daily as needed., Disp: 60 tablet, Rfl: 1   estradiol  (MINIVELLE ) 0.05 MG/24HR patch, Place 1 patch onto the skin 2 times a week., Disp: 24 patch, Rfl: 3   estradiol  (VIVELLE -DOT) 0.0375 MG/24HR, Place 1 patch onto the skin 2 (two) times a week., Disp: 24 patch, Rfl: 1   estradiol -norethindrone  (COMBIPATCH ) 0.05-0.25 MG/DAY, Apply 1 patch twice a week by transdermal route., Disp: 24 patch, Rfl: 4   Estradiol -Norethindrone  Acet 0.5-0.1 MG tablet, Take 1 tablet by mouth daily., Disp: 84 tablet, Rfl: 3   fluticasone  (FLONASE  ALLERGY RELIEF) 50 MCG/ACT nasal spray, Use 1 spray in each nostril once a day, Disp: 48 g, Rfl: 0   fluticasone  (FLONASE ) 50 MCG/ACT nasal spray, Place 2 sprays into both nostrils daily., Disp: 16 g, Rfl: 6   hydrOXYzine  (ATARAX /VISTARIL ) 25 MG tablet, take 1 tablet by  mouth at bedtime as needed (Patient not taking: Reported on 02/20/2021), Disp: 30 tablet, Rfl: 2   ketoconazole  (NIZORAL ) 2 % cream, Apply to affected area 2 times a day, Disp: 60 g, Rfl: 1   metoprolol  succinate (TOPROL -XL) 25 MG 24 hr tablet, Take 1 tablet by mouth once a day, Disp: 90 tablet, Rfl: 1   metoprolol  succinate (TOPROL -XL) 25 MG 24 hr tablet, Take 1 tablet (25 mg total) by mouth daily., Disp: 90 tablet, Rfl: 2   metoprolol  succinate (TOPROL -XL) 25 MG 24 hr tablet, Take 1 tablet (25 mg total) by mouth daily., Disp: 90 tablet, Rfl: 0   metroNIDAZOLE (METROCREAM) 0.75 % cream, As needed., Disp: , Rfl:    montelukast  (SINGULAIR ) 10 MG tablet, Take 1 tablet (10 mg total) by mouth daily., Disp: 90 tablet, Rfl: 0   Multiple Vitamin (MULTIVITAMIN WITH MINERALS) TABS tablet, Take 1 tablet by mouth daily., Disp: , Rfl:    Omega-3 Fatty Acids (FISH OIL) 1000 MG CAPS, 1 capsule, Disp: , Rfl:    omeprazole (PRILOSEC) 40 MG capsule, 1 capsule 30 minutes before morning meal, Disp: , Rfl:    Probiotic Product (PROBIOTIC FORMULA PO), Take 1 tablet by mouth daily., Disp: , Rfl:    progesterone  (PROMETRIUM ) 200 MG capsule, Take 1 capsule (200 mg total) by mouth at bedtime., Disp: 90 capsule, Rfl: 4   progesterone  (PROMETRIUM ) 200 MG capsule, Take 1 capsule (200 mg total) by mouth at bedtime.,  Disp: 90 capsule, Rfl: 3  Labs:  BP Readings from Last 3 Encounters:  05/21/22 126/85  02/20/21 136/88  02/19/21 94/73   No results found for: CHOL, HDL, LDLCALC, LDLDIRECT, TRIG, CHOLHDL No results found for: YHAJ8R Last vitamin D 08/07/2023: 72.7 per Pt reports No results found for: 25OHVITD2, 25OHVITD3, VD25OH Notable Signs/Symptoms:  Wt Readings from Last 3 Encounters:  05/21/22 160 lb (72.6 kg)  02/20/21 154 lb 15.7 oz (70.3 kg)  02/19/21 155 lb (70.3 kg)   BMI Readings from Last 3 Encounters:  05/21/22 25.82 kg/m  02/20/21 25.01 kg/m  02/19/21 25.02 kg/m   Lifestyle &  Dietary Hx Pt presents today alone for follow up. Pt states protein intake is going well stating using a tracker. Pt reports using protein bar and shakes and find alternatives sources that are plant based a challenge.  Pt c/o constipation with increase protein intake. Pt reports her water intake remains not enough stating ~48 oz. Pt reports using an app to track her physical activity stating 140-160 minutes most weeks exceeding 3 days weekly. Pt reports she continues taking a MVI with 200 mg calcium daily. Pt reports her appetite is improving stating feeling more satisfied. Pt reports running decreased with hip pain and focused on walking moving forward. Pt reports weather as a barrier to physical activity participation and RD shared you tube videos. All Pt's questions were answered during this encounter.  Hx: Pt states she is in menopause. Pt c/o elevation in blood pressure. Pt reports full time mostly sitting. Pt reports she does the cooking and shopping. Pt reports she had a DEXA scan that indicated osteopenia and osteoporosis. Pt reports HRT patch in place. Pt reports trying the following to address her health by increasing protein, taking MVI with calcium and using a weighted vest when walking.  Pt reports selecting sea salt.   Estimated daily fluid intake: not enough 45 oz Supplements: MVI with calcium, collagen peptides, vitamin D  Sleep: improving on average 7 hours nightly  Stress / self-care: 6 out of 10 / self care includes: vacation, meditation, wine Current average weekly physical activity: 150 minutes walking weekly  NUTRITION DIAGNOSIS  NB-1.1 Food and nutrition-related knowledge deficit As related to no prior nutrition related education.  As evidenced by Pt reports and dietary recall.  NUTRITION INTERVENTION  Nutrition education (E-1) on the following topics:  Fruits & Vegetables: Aim to fill half your plate with a variety of fruits and vegetables. They are rich in vitamins,  minerals, and fiber, and can help reduce the risk of chronic diseases. Choose a colorful assortment of fruits and vegetables to ensure you get a wide range of nutrients. Grains and Starches: Make at least half of your grain choices whole grains, such as brown rice, whole wheat bread, and oats. Whole grains provide fiber, which aids in digestion and healthy cholesterol levels. Aim for whole forms of starchy vegetables such as potatoes, sweet potatoes, beans, peas, and corn, which are fiber rich and provide many vitamins and minerals.  Protein: Incorporate lean sources of protein, such as tofu, vegetable analogs, beans, nuts, and seeds, into your meals. Protein is essential for building and repairing tissues, staying full, balancing blood sugar, as well as supporting immune function. Dairy: Include low-fat or fat-free dairy products like milk, yogurt, and cheese in your diet. Dairy foods are excellent sources of calcium and vitamin D, which are crucial for bone health.  Physical Activity: Aim for 60 minutes of physical activity daily. Regular physical  activity promotes overall health-including helping to reduce risk for heart disease and diabetes, promoting mental health, and helping us  sleep better.   Handouts Provided Include  [employer] recipe analyzer Move Your Way-[employer] Vegetarian Protein List   Learning Style & Readiness for Change Teaching method utilized: Visual & Auditory  Demonstrated degree of understanding via: Teach Back  Barriers to learning/adherence to lifestyle change: weather, pain  Goals Established by Pt Increase water intake aiming for 64-90 ounces daily Maintain physical activity of 150 minutes weekly Consider adding weight resistance training 2 days weekly as tolerated  MONITORING & EVALUATION Dietary intake, weekly physical activity  Next Steps  Patient is to return: 02/06/2024

## 2023-12-08 ENCOUNTER — Other Ambulatory Visit (HOSPITAL_COMMUNITY): Payer: Self-pay

## 2023-12-09 ENCOUNTER — Other Ambulatory Visit (HOSPITAL_COMMUNITY): Payer: Self-pay

## 2023-12-12 ENCOUNTER — Encounter: Attending: Family Medicine | Admitting: Dietician

## 2023-12-12 DIAGNOSIS — E663 Overweight: Secondary | ICD-10-CM | POA: Insufficient documentation

## 2023-12-26 ENCOUNTER — Ambulatory Visit: Admitting: Professional Counselor

## 2023-12-26 ENCOUNTER — Encounter: Payer: Self-pay | Admitting: Professional Counselor

## 2023-12-26 DIAGNOSIS — F4323 Adjustment disorder with mixed anxiety and depressed mood: Secondary | ICD-10-CM | POA: Diagnosis not present

## 2023-12-26 NOTE — Progress Notes (Signed)
 [facility] Counselor/Therapist Progress Note  Patient ID: Tara Lopez, MRN: [id number],    Date: 12/26/2023  Time Spent: 2:16 PM to 3:24 PM  Treatment Type: Individual Therapy  I connected with this patient by an approved telecommunication method (video), with her informed consent, and verifying identity and patient privacy.  I was located at my office and patient at her home.  As needed, we discussed the limitations, risks, and security and privacy concerns associated with telehealth service, including the availability and conditions which currently govern in-person appointments and the possibility that 3rd-party payment may not be fully guaranteed and she may be responsible for charges.  After she indicated understanding, we proceeded with the session.  Also discussed treatment planning, as needed, including ongoing verbal agreement with the plan, the opportunity to ask and answer all questions, her demonstrated understanding of instructions, and her readiness to call the office should symptoms worsen or she feels she is in a crisis state and needs more immediate and tangible assistance.   Reported Symptoms: Sense of overwhelm, preemptive grief/loss, tearfulness, stress, caregiver strain, worries, low mood, sadness, frustration, anhedonia, sleep concerns, anxiousness, irritability, catastrophic thinking  Mental Status Exam:  Appearance:   Neat     Behavior:  Appropriate and Sharing  Motor:  Normal  Speech/Language:   Clear and Coherent and Normal Rate  Affect:  Tearful  Mood:  sad  Thought process:  normal  Thought content:    WNL  Sensory/Perceptual disturbances:    WNL  Orientation:  oriented to person, place, time/date, and situation  Attention:  Good  Concentration:  Good  Memory:  WNL  Fund of knowledge:   Good  Insight:    Good  Judgment:   Good  Impulse Control:  Good   Risk Assessment: Danger to Self:  No Self-injurious Behavior: No Danger to Others: No Duty  to Warn:no Physical Aggression / Violence:No  Access to Firearms a concern: No  Gang Involvement:No   Subjective: Patient presented to session to address concerns of anxiety and depression.  She reported minimal progress at this time.  Patient identified experience of work stress, and decline in her mother's health as worrisome.  She processed experience of her mothers health and her father's need for increased to help and companionship.  Counselor actively listened, affirmed patient feelings and experience, and assisted patient in strategies her approach to conversation with her father regarding collaborating about meeting needs, and being open about their shared grief process.  Patient also processed experience of challenges with her son's girlfriend, and counselor assisted patient in identifying boundary needs and and developing approach to conversation to implement them.  Counselor facilitated GAD-7, patient scored a 12, and PHQ-9, patient scored a 3.  Counselor and patient discussed results, and ways in which depression scale did not resonate with patient exacerbated depressive symptoms.  Counselor and patient discussed patient updated treatment plan, and patient gave her verbal consent, session being via telehealth.  Counselor and patient discussed patient experience of depressive symptoms, and possibility of patient psychiatric consult to consider medicinal support.  Counselor reinforced patient restorative time for herself, benefit of emotional release, and facilitated insight into patient taking care of herself as benefiting her loved ones as well and increasing her emotional availability.  Interventions: Solution-Oriented/Positive Psychology, Humanistic/Existential, Insight-Oriented, and Assessment, Treatment Planning  Diagnosis:   ICD-10-CM   1. Adjustment disorder with mixed anxiety and depressed mood  F43.23       Plan: Patient is scheduled  for follow-up; continue process work and  conservation officer, historic buildings.  Patient short-term goal between sessions for patient to prioritize restorative time, continue to implement boundaries were needed, discussed caregiving concerns and grief process with father.  [name], [facility]

## 2024-01-22 NOTE — Progress Notes (Unsigned)
 Medical Nutrition Therapy  Appointment Start time:  [phone] Appointment End time: [phone]  Primary concerns today: weight gain with menopause  Referral diagnosis: employee visit 3  Preferred learning style:  no preference indicated Learning readiness: ready-change in progress   NUTRITION ASSESSMENT   Clinical Medical Hx:  Past Medical History:  Diagnosis Date   GERD (gastroesophageal reflux disease)     Medications:  Current Outpatient Medications:    alendronate  (FOSAMAX ) 70 MG tablet, Take 1 tablet (70 mg total) by mouth once a week., Disp: 12 tablet, Rfl: 4   betamethasone dipropionate (DIPROLENE) 0.05 % cream, Take as directed., Disp: , Rfl:    buPROPion  (WELLBUTRIN  XL) 150 MG 24 hr tablet, Take 1 tablet by mouth in the morning, Disp: 30 tablet, Rfl: 2   cetirizine (ZYRTEC) 10 MG tablet, Take 10 mg by mouth daily., Disp: , Rfl:    Cholecalciferol (VITAMIN D) 50 MCG (2000 UT) tablet, 1 tablet, Disp: , Rfl:    desoximetasone (TOPICORT) 0.05 % cream, Apply 1 application topically 2 (two) times daily., Disp: , Rfl:    diclofenac  (VOLTAREN ) 50 MG EC tablet, Take 1 tablet (50 mg total) by mouth 2 (two) times daily as needed., Disp: 60 tablet, Rfl: 1   estradiol  (MINIVELLE ) 0.05 MG/24HR patch, Place 1 patch onto the skin 2 times a week., Disp: 24 patch, Rfl: 3   estradiol  (VIVELLE -DOT) 0.0375 MG/24HR, Place 1 patch onto the skin 2 (two) times a week., Disp: 24 patch, Rfl: 1   estradiol -norethindrone  (COMBIPATCH ) 0.05-0.25 MG/DAY, Apply 1 patch twice a week by transdermal route., Disp: 24 patch, Rfl: 4   Estradiol -Norethindrone  Acet 0.5-0.1 MG tablet, Take 1 tablet by mouth daily., Disp: 84 tablet, Rfl: 3   fluticasone  (FLONASE  ALLERGY RELIEF) 50 MCG/ACT nasal spray, Use 1 spray in each nostril once a day, Disp: 48 g, Rfl: 0   fluticasone  (FLONASE ) 50 MCG/ACT nasal spray, Place 2 sprays into both nostrils daily., Disp: 16 g, Rfl: 6   hydrOXYzine  (ATARAX /VISTARIL ) 25 MG tablet, take 1 tablet by  mouth at bedtime as needed (Patient not taking: Reported on 02/20/2021), Disp: 30 tablet, Rfl: 2   ketoconazole  (NIZORAL ) 2 % cream, Apply to affected area 2 times a day, Disp: 60 g, Rfl: 1   metoprolol  succinate (TOPROL -XL) 25 MG 24 hr tablet, Take 1 tablet by mouth once a day, Disp: 90 tablet, Rfl: 1   metoprolol  succinate (TOPROL -XL) 25 MG 24 hr tablet, Take 1 tablet (25 mg total) by mouth daily., Disp: 90 tablet, Rfl: 2   metoprolol  succinate (TOPROL -XL) 25 MG 24 hr tablet, Take 1 tablet (25 mg total) by mouth daily., Disp: 90 tablet, Rfl: 0   metroNIDAZOLE (METROCREAM) 0.75 % cream, As needed., Disp: , Rfl:    montelukast  (SINGULAIR ) 10 MG tablet, Take 1 tablet (10 mg total) by mouth daily., Disp: 90 tablet, Rfl: 0   Multiple Vitamin (MULTIVITAMIN WITH MINERALS) TABS tablet, Take 1 tablet by mouth daily., Disp: , Rfl:    Omega-3 Fatty Acids (FISH OIL) 1000 MG CAPS, 1 capsule, Disp: , Rfl:    omeprazole (PRILOSEC) 40 MG capsule, 1 capsule 30 minutes before morning meal, Disp: , Rfl:    Probiotic Product (PROBIOTIC FORMULA PO), Take 1 tablet by mouth daily., Disp: , Rfl:    progesterone  (PROMETRIUM ) 200 MG capsule, Take 1 capsule (200 mg total) by mouth at bedtime., Disp: 90 capsule, Rfl: 4   progesterone  (PROMETRIUM ) 200 MG capsule, Take 1 capsule (200 mg total) by mouth at bedtime.,  Disp: 90 capsule, Rfl: 3  Labs:  BP Readings from Last 3 Encounters:  05/21/22 126/85  02/20/21 136/88  02/19/21 94/73   No results found for: CHOL, HDL, LDLCALC, LDLDIRECT, TRIG, CHOLHDL No results found for: YHAJ8R Last vitamin D 08/07/2023: 72.7 per Pt reports No results found for: 25OHVITD2, 25OHVITD3, VD25OH Notable Signs/Symptoms:  Wt Readings from Last 3 Encounters:  05/21/22 160 lb (72.6 kg)  02/20/21 154 lb 15.7 oz (70.3 kg)  02/19/21 155 lb (70.3 kg)   BMI Readings from Last 3 Encounters:  05/21/22 25.82 kg/m  02/20/21 25.01 kg/m  02/19/21 25.02 kg/m   Lifestyle &  Dietary Hx Pt presents today alone for follow up. Pt reports challenges includes: time to do weights so switched to yoga, stretching, cores exercise 3 times weekly for the past two week. Pt reports walking is going well stating maintaining 150 minutes. Pt reports her non starchy vegetables intake challenging and aiming for incorporating non starchy veggies into other dishes going well. Pt reports increasing water intake aiming for 55-60 ounces daily. Pt reports she continues taking a MVI with 200 mg calcium daily. Pt reports obtaining a blood pressure of 118/68. All Pt's questions were answered during this encounter.  Hx: Pt reports full time mostly sitting. Pt reports she does the cooking and shopping. Pt reports she had a DEXA scan that indicated osteopenia and osteoporosis.  Estimated daily fluid intake: not enough 45 oz Supplements: MVI with 200 mg calcium, vitamin D  Sleep: improving;  on average 7-8 hours nightly  Stress / self-care: 7-8 out of 10 / self care includes: vacation, meditation, wine Current average weekly physical activity: 150 minutes walking weekly  Breakfast: sour dough, cheese, avocado  Lunch:  Snack : protein bar or protein shake  Dinner: noodles with added protein and non starchy vegetables  NUTRITION DIAGNOSIS  NB-1.1 Food and nutrition-related knowledge deficit As related to no prior nutrition related education.  As evidenced by Pt reports and dietary recall.  NUTRITION INTERVENTION  Nutrition education (E-1) on the following topics:  Fruits & Vegetables: Aim to fill half your plate with a variety of fruits and vegetables. They are rich in vitamins, minerals, and fiber, and can help reduce the risk of chronic diseases. Choose a colorful assortment of fruits and vegetables to ensure you get a wide range of nutrients. Grains and Starches: Make at least half of your grain choices whole grains, such as brown rice, whole wheat bread, and oats. Whole grains provide fiber,  which aids in digestion and healthy cholesterol levels. Aim for whole forms of starchy vegetables such as potatoes, sweet potatoes, beans, peas, and corn, which are fiber rich and provide many vitamins and minerals.  Protein: Incorporate lean sources of protein, such as tofu, vegetable analogs, beans, nuts, and seeds, into your meals. Protein is essential for building and repairing tissues, staying full, balancing blood sugar, as well as supporting immune function. Dairy: Include low-fat or fat-free dairy products like milk, yogurt, and cheese in your diet. Dairy foods are excellent sources of calcium and vitamin D, which are crucial for bone health.  Physical Activity: Aim for 60 minutes of physical activity daily. Regular physical activity promotes overall health-including helping to reduce risk for heart disease and diabetes, promoting mental health, and helping us  sleep better.   Learning Style & Readiness for Change Teaching method utilized: Visual & Auditory  Demonstrated degree of understanding via: Teach Back  Barriers to learning/adherence to lifestyle change: pain, time  Goals Established  by Pt Maintain physical activity of 150 minutes weekly with 2 days of muscle strengthening activities   MONITORING & EVALUATION Dietary intake, weekly physical activity  Next Steps  Patient is to return: 04/02/2024

## 2024-01-30 ENCOUNTER — Encounter: Attending: Family Medicine | Admitting: Dietician

## 2024-01-30 VITALS — Wt 173.8 lb

## 2024-01-30 DIAGNOSIS — E663 Overweight: Secondary | ICD-10-CM | POA: Insufficient documentation

## 2024-02-05 ENCOUNTER — Other Ambulatory Visit (HOSPITAL_COMMUNITY): Payer: Self-pay

## 2024-02-06 ENCOUNTER — Other Ambulatory Visit (HOSPITAL_COMMUNITY): Payer: Self-pay

## 2024-02-06 ENCOUNTER — Other Ambulatory Visit: Payer: Self-pay

## 2024-02-06 MED ORDER — ESTRADIOL 0.0375 MG/24HR TD PTTW
1.0000 | MEDICATED_PATCH | TRANSDERMAL | 1 refills | Status: AC
  Filled 2024-02-06 (×2): qty 24, 84d supply, fill #0

## 2024-02-09 ENCOUNTER — Other Ambulatory Visit: Payer: Self-pay

## 2024-02-19 ENCOUNTER — Other Ambulatory Visit (HOSPITAL_COMMUNITY): Payer: Self-pay

## 2024-02-20 ENCOUNTER — Other Ambulatory Visit: Payer: Self-pay

## 2024-02-20 ENCOUNTER — Other Ambulatory Visit (HOSPITAL_COMMUNITY): Payer: Self-pay

## 2024-02-20 MED ORDER — PROGESTERONE 200 MG PO CAPS
200.0000 mg | ORAL_CAPSULE | Freq: Every day | ORAL | 2 refills | Status: AC
  Filled 2024-02-20 – 2024-02-26 (×2): qty 90, 90d supply, fill #0
  Filled 2024-02-27: qty 48, 48d supply, fill #0
  Filled 2024-02-27: qty 42, 42d supply, fill #0
  Filled 2024-02-27: qty 48, 48d supply, fill #0

## 2024-02-23 ENCOUNTER — Other Ambulatory Visit: Payer: Self-pay

## 2024-02-26 ENCOUNTER — Other Ambulatory Visit: Payer: Self-pay

## 2024-02-26 ENCOUNTER — Other Ambulatory Visit (HOSPITAL_COMMUNITY): Payer: Self-pay

## 2024-02-27 ENCOUNTER — Other Ambulatory Visit (HOSPITAL_COMMUNITY): Payer: Self-pay

## 2024-02-27 ENCOUNTER — Other Ambulatory Visit: Payer: Self-pay

## 2024-03-01 ENCOUNTER — Other Ambulatory Visit (HOSPITAL_COMMUNITY): Payer: Self-pay

## 2024-03-01 MED ORDER — METOPROLOL SUCCINATE ER 25 MG PO TB24
25.0000 mg | ORAL_TABLET | Freq: Every day | ORAL | 1 refills | Status: AC
  Filled 2024-03-01: qty 90, 90d supply, fill #0

## 2024-03-02 ENCOUNTER — Other Ambulatory Visit (HOSPITAL_COMMUNITY): Payer: Self-pay

## 2024-03-09 ENCOUNTER — Other Ambulatory Visit (HOSPITAL_COMMUNITY): Payer: Self-pay

## 2024-03-09 DIAGNOSIS — J019 Acute sinusitis, unspecified: Secondary | ICD-10-CM | POA: Diagnosis not present

## 2024-03-09 MED ORDER — PROMETHAZINE-DM 6.25-15 MG/5ML PO SYRP
5.0000 mL | ORAL_SOLUTION | Freq: Every day | ORAL | 0 refills | Status: AC
  Filled 2024-03-09: qty 35, 7d supply, fill #0

## 2024-03-09 MED ORDER — AMOXICILLIN-POT CLAVULANATE 875-125 MG PO TABS
1.0000 | ORAL_TABLET | Freq: Two times a day (BID) | ORAL | 0 refills | Status: AC
  Filled 2024-03-09: qty 20, 10d supply, fill #0

## 2024-03-25 NOTE — Progress Notes (Unsigned)
 Medical Nutrition Therapy  Appointment Start time:  0800 Appointment End time: 0830  Primary concerns today: reducing body weight to a lower BMI range, menopause  Referral diagnosis: employee visit 1of 3 (2026) Preferred learning style:  no preference indicated Learning readiness: ready-change in progress   NUTRITION ASSESSMENT   Clinical Medical Hx:  Past Medical History:  Diagnosis Date   GERD (gastroesophageal reflux disease)     Medications:  Current Outpatient Medications:    alendronate  (FOSAMAX ) 70 MG tablet, Take 1 tablet (70 mg total) by mouth once a week., Disp: 12 tablet, Rfl: 4   amoxicillin -clavulanate (AUGMENTIN ) 875-125 MG tablet, Take 1 tablet by mouth every 12 (twelve) hours., Disp: 20 tablet, Rfl: 0   betamethasone dipropionate (DIPROLENE) 0.05 % cream, Take as directed., Disp: , Rfl:    buPROPion  (WELLBUTRIN  XL) 150 MG 24 hr tablet, Take 1 tablet by mouth in the morning, Disp: 30 tablet, Rfl: 2   cetirizine (ZYRTEC) 10 MG tablet, Take 10 mg by mouth daily., Disp: , Rfl:    Cholecalciferol (VITAMIN D) 50 MCG (2000 UT) tablet, 1 tablet, Disp: , Rfl:    desoximetasone (TOPICORT) 0.05 % cream, Apply 1 application topically 2 (two) times daily., Disp: , Rfl:    diclofenac  (VOLTAREN ) 50 MG EC tablet, Take 1 tablet (50 mg total) by mouth 2 (two) times daily as needed., Disp: 60 tablet, Rfl: 1   estradiol  (MINIVELLE ) 0.05 MG/24HR patch, Place 1 patch onto the skin 2 times a week., Disp: 24 patch, Rfl: 3   estradiol  (VIVELLE -DOT) 0.0375 MG/24HR, Place 1 patch onto the skin 2 (two) times a week., Disp: 24 patch, Rfl: 1   estradiol  (VIVELLE -DOT) 0.0375 MG/24HR, Place 1 patch onto the skin 2 (two) times a week., Disp: 24 patch, Rfl: 1   estradiol -norethindrone  (COMBIPATCH ) 0.05-0.25 MG/DAY, Apply 1 patch twice a week by transdermal route., Disp: 24 patch, Rfl: 4   Estradiol -Norethindrone  Acet 0.5-0.1 MG tablet, Take 1 tablet by mouth daily., Disp: 84 tablet, Rfl: 3    fluticasone  (FLONASE  ALLERGY RELIEF) 50 MCG/ACT nasal spray, Use 1 spray in each nostril once a day, Disp: 48 g, Rfl: 0   fluticasone  (FLONASE ) 50 MCG/ACT nasal spray, Place 2 sprays into both nostrils daily., Disp: 16 g, Rfl: 6   hydrOXYzine  (ATARAX /VISTARIL ) 25 MG tablet, take 1 tablet by mouth at bedtime as needed (Patient not taking: Reported on 02/20/2021), Disp: 30 tablet, Rfl: 2   ketoconazole  (NIZORAL ) 2 % cream, Apply to affected area 2 times a day, Disp: 60 g, Rfl: 1   metoprolol  succinate (TOPROL -XL) 25 MG 24 hr tablet, Take 1 tablet by mouth once a day, Disp: 90 tablet, Rfl: 1   metoprolol  succinate (TOPROL -XL) 25 MG 24 hr tablet, Take 1 tablet (25 mg total) by mouth daily., Disp: 90 tablet, Rfl: 2   metoprolol  succinate (TOPROL -XL) 25 MG 24 hr tablet, Take 1 tablet (25 mg total) by mouth daily., Disp: 90 tablet, Rfl: 0   metoprolol  succinate (TOPROL -XL) 25 MG 24 hr tablet, Take 1 tablet (25 mg total) by mouth daily., Disp: 90 tablet, Rfl: 1   metroNIDAZOLE (METROCREAM) 0.75 % cream, As needed., Disp: , Rfl:    montelukast  (SINGULAIR ) 10 MG tablet, Take 1 tablet (10 mg total) by mouth daily., Disp: 90 tablet, Rfl: 0   Multiple Vitamin (MULTIVITAMIN WITH MINERALS) TABS tablet, Take 1 tablet by mouth daily., Disp: , Rfl:    Omega-3 Fatty Acids (FISH OIL) 1000 MG CAPS, 1 capsule, Disp: , Rfl:  omeprazole (PRILOSEC) 40 MG capsule, 1 capsule 30 minutes before morning meal, Disp: , Rfl:    Probiotic Product (PROBIOTIC FORMULA PO), Take 1 tablet by mouth daily., Disp: , Rfl:    progesterone  (PROMETRIUM ) 200 MG capsule, Take 1 capsule (200 mg total) by mouth at bedtime., Disp: 90 capsule, Rfl: 4   progesterone  (PROMETRIUM ) 200 MG capsule, Take 1 capsule (200 mg total) by mouth at bedtime., Disp: 90 capsule, Rfl: 3   progesterone  (PROMETRIUM ) 200 MG capsule, Take 1 capsule (200 mg total) by mouth at bedtime., Disp: 90 capsule, Rfl: 2   promethazine -dextromethorphan (PROMETHAZINE -DM) 6.25-15  MG/5ML syrup, Take 5 mLs by mouth at bedtime., Disp: 35 mL, Rfl: 0  Labs:  BP Readings from Last 3 Encounters:  05/21/22 126/85  02/20/21 136/88  02/19/21 94/73   No results found for: CHOL, HDL, LDLCALC, LDLDIRECT, TRIG, CHOLHDL No results found for: YHAJ8R Last vitamin D 08/07/2023: 72.7 per Pt reports No results found for: 25OHVITD2, 25OHVITD3, VD25OH Notable Signs/Symptoms:  Wt Readings from Last 3 Encounters:  01/30/24 173 lb 12.8 oz (78.8 kg)  05/21/22 160 lb (72.6 kg)  02/20/21 154 lb 15.7 oz (70.3 kg)   BMI Readings from Last 3 Encounters:  01/30/24 28.05 kg/m  05/21/22 25.82 kg/m  02/20/21 25.01 kg/m   Lifestyle & Dietary Hx Pt presents today alone. Pt desires weight reductions to a lower BMI range. Pt reports her mother passed and she is grieving, therapy upcoming and states many meals brought in from family and friends. Pt plans to see a hormone specialist for menopause concerns next week. Pt reports she is interested in learning more and pro and cons of admin with weight loss medications. Pt reports weather as a barrier to physical activity participation. Pt reports she has been focusing on yoga, core exercises on 1-2 days weekly. Pt reports she enjoys walking does not prefer exercise videos.  Pt reports hiking once weekly to begin with a group that she signed up to attend. All Pt's questions were answered during this encounter. Hx: Pt reports full time mostly sitting. Pt reports she does the cooking and shopping. Pt reports she had a DEXA scan that indicated osteopenia and osteoporosis.  Estimated daily fluid intake:  50 oz Supplements: MVI, 500 mg calcium once daily (RD encouraged 600 mg BID), vitamin D 1000 international units daily Sleep: poor Stress / self-care: 8 out of 10 / self care includes: vacation, meditation, wine Current average weekly physical activity: hiking, core exercises, yoga   Breakfast:  spinach, hemp, berries, greek yogurt  home made smoothie Lunch: packed lunch mushrooms and tomato quiche, carrots, water or subway 6 inch with white bread, spinach, cucumbers, water or sweet potato, chicken Dinner: pasta, cheese, cauliflower, salad with dressing, water, quiche and salad with water   NUTRITION DIAGNOSIS  NB-1.1 Food and nutrition-related knowledge deficit As related to no prior nutrition related education.  As evidenced by Pt reports and dietary recall.  NUTRITION INTERVENTION  Nutrition education (E-1) on the following topics:  Fruits & Vegetables: Aim to fill half your plate with a variety of fruits and vegetables. They are rich in vitamins, minerals, and fiber, and can help reduce the risk of chronic diseases. Choose a colorful assortment of fruits and vegetables to ensure you get a wide range of nutrients. Grains and Starches: Make at least half of your grain choices whole grains, such as brown rice, whole wheat bread, and oats. Whole grains provide fiber, which aids in digestion and healthy cholesterol levels.  Aim for whole forms of starchy vegetables such as potatoes, sweet potatoes, beans, peas, and corn, which are fiber rich and provide many vitamins and minerals.  Protein: Incorporate lean sources of protein, such as tofu, vegetable analogs, beans, nuts, and seeds, into your meals. Protein is essential for building and repairing tissues, staying full, balancing blood sugar, as well as supporting immune function. Dairy: Include low-fat or fat-free dairy products like milk, yogurt, and cheese in your diet. Dairy foods are excellent sources of calcium and vitamin D, which are crucial for bone health.  Physical Activity: Aim for 60 minutes of physical activity daily. Regular physical activity promotes overall health-including helping to reduce risk for heart disease and diabetes, promoting mental health, and helping us  sleep better.   Learning Style & Readiness for Change Teaching method utilized: Visual & Auditory   Demonstrated degree of understanding via: Teach Back  Barriers to learning/adherence to lifestyle change: time, grief  Goals Established by Pt Physical activity 2-3 days weekly; core exercises, hiking, walking, yoga   MONITORING & EVALUATION Dietary intake, weekly physical activity  Next Steps  Patient is to return 05/28/2024

## 2024-04-02 ENCOUNTER — Encounter: Attending: Family Medicine | Admitting: Dietician

## 2024-04-02 DIAGNOSIS — E663 Overweight: Secondary | ICD-10-CM | POA: Insufficient documentation

## 2024-04-02 NOTE — Patient Instructions (Signed)
 Physical activity 2-3 days weekly or more; core exercises, hiking, walking, yoga

## 2024-04-09 ENCOUNTER — Other Ambulatory Visit (HOSPITAL_COMMUNITY): Payer: Self-pay

## 2024-04-09 ENCOUNTER — Other Ambulatory Visit: Payer: Self-pay

## 2024-04-09 MED ORDER — METFORMIN HCL 500 MG PO TABS
500.0000 mg | ORAL_TABLET | Freq: Every day | ORAL | 1 refills | Status: AC
  Filled 2024-04-09 (×2): qty 90, 90d supply, fill #0

## 2024-04-09 MED ORDER — BUPROPION HCL ER (XL) 150 MG PO TB24
150.0000 mg | ORAL_TABLET | Freq: Every day | ORAL | 1 refills | Status: AC
  Filled 2024-04-09 (×2): qty 90, 90d supply, fill #0

## 2024-04-10 ENCOUNTER — Other Ambulatory Visit (HOSPITAL_COMMUNITY): Payer: Self-pay

## 2024-04-12 ENCOUNTER — Other Ambulatory Visit: Payer: Self-pay

## 2024-04-14 ENCOUNTER — Encounter (INDEPENDENT_AMBULATORY_CARE_PROVIDER_SITE_OTHER): Payer: Self-pay

## 2024-04-16 ENCOUNTER — Encounter: Payer: Self-pay | Admitting: Professional Counselor

## 2024-04-16 ENCOUNTER — Ambulatory Visit (INDEPENDENT_AMBULATORY_CARE_PROVIDER_SITE_OTHER): Admitting: Professional Counselor

## 2024-04-16 DIAGNOSIS — Z634 Disappearance and death of family member: Secondary | ICD-10-CM

## 2024-04-16 DIAGNOSIS — F4323 Adjustment disorder with mixed anxiety and depressed mood: Secondary | ICD-10-CM

## 2024-04-16 NOTE — Progress Notes (Signed)
"   °      [facility] Counselor/Therapist Progress Note  Patient ID: Tara Lopez, MRN: [id number],    Date: 04/16/2024  Time Spent: 11:16 AM to 11:54 AM  Treatment Type: Individual Therapy  Reported Symptoms: Grief/loss, tearfulness, sadness, worries, anxiousness, frustration, interpersonal concerns, phase of life concerns  Mental Status Exam:  Appearance:   Casual     Behavior:  Appropriate and Sharing  Motor:  Normal  Speech/Language:   Clear and Coherent and Normal Rate  Affect:  Tearful  Mood:  sad  Thought process:  normal  Thought content:    WNL  Sensory/Perceptual disturbances:    WNL  Orientation:  oriented to person, place, time/date, and situation  Attention:  Good  Concentration:  Good  Memory:  WNL  Fund of knowledge:   Good  Insight:    Good  Judgment:   Good  Impulse Control:  Good   Risk Assessment: Danger to Self:  No Self-injurious Behavior: No Danger to Others: No Duty to Warn:no Physical Aggression / Violence:No  Access to Firearms a concern: No  Gang Involvement:No   Subjective: Patient presented to session to address concerns of anxiety and depression, and grief.  Patient reported having lost her mother recently after an unexpected worsening decline.  Counselor actively listened and affirmed patient, holding space for patient grief and loss.  Patient also processed experience of challenging trajectory with her ex-husband, and self advocacy efforts she has been making since prior session.  Counselor actively listened and affirmed patient proactive efforts and sense of self and family protection.  Patient identified fearfulness regarding outcome, and counselor helped to instill hope and patient in identifying protective factors to circumstance.  Interventions: Solution-Oriented/Positive Psychology, Humanistic/Existential, Grief Therapy, and Insight-Oriented  Diagnosis:   ICD-10-CM   1. Adjustment disorder with mixed anxiety and depressed mood  F43.23      2. Bereavement  Z63.4       Plan: Patient is scheduled for follow-up; continue process work and developing coping skills.  Patient short-term goal between sessions to resource self-care, hopefulness, and support system including as relates self and family protection.  [name], [facility]                   "

## 2024-04-19 ENCOUNTER — Encounter

## 2024-04-19 DIAGNOSIS — Z1231 Encounter for screening mammogram for malignant neoplasm of breast: Secondary | ICD-10-CM

## 2024-05-10 ENCOUNTER — Ambulatory Visit: Admitting: Professional Counselor

## 2024-05-28 ENCOUNTER — Encounter: Admitting: Dietician

## 2024-06-07 ENCOUNTER — Ambulatory Visit: Admitting: Professional Counselor

## 2024-07-12 ENCOUNTER — Ambulatory Visit: Admitting: Professional Counselor

## 2024-08-02 ENCOUNTER — Ambulatory Visit: Admitting: Professional Counselor

## 2024-09-06 ENCOUNTER — Ambulatory Visit: Admitting: Professional Counselor

## 2024-09-27 ENCOUNTER — Ambulatory Visit: Admitting: Professional Counselor
